# Patient Record
Sex: Male | Born: 1955 | Race: White | Hispanic: No | Marital: Married | Smoking: Never smoker
Health system: Southern US, Community
[De-identification: ages and names within clinical notes are randomized; demographics above are authoritative.]

## PROBLEM LIST (undated history)

## (undated) DIAGNOSIS — E785 Hyperlipidemia, unspecified: Secondary | ICD-10-CM

## (undated) DIAGNOSIS — E119 Type 2 diabetes mellitus without complications: Secondary | ICD-10-CM

## (undated) DIAGNOSIS — I10 Essential (primary) hypertension: Secondary | ICD-10-CM

## (undated) DIAGNOSIS — N200 Calculus of kidney: Secondary | ICD-10-CM

## (undated) HISTORY — DX: Calculus of kidney: N20.0

## (undated) HISTORY — DX: Type 2 diabetes mellitus without complications: E11.9

## (undated) HISTORY — DX: Hyperlipidemia, unspecified: E78.5

## (undated) HISTORY — DX: Essential (primary) hypertension: I10

---

## 1997-05-09 ENCOUNTER — Encounter: Admission: RE | Admit: 1997-05-09 | Discharge: 1997-07-11 | Payer: Self-pay | Admitting: Family Medicine

## 2001-09-18 ENCOUNTER — Encounter: Payer: Self-pay | Admitting: Family Medicine

## 2001-09-18 ENCOUNTER — Encounter: Admission: RE | Admit: 2001-09-18 | Discharge: 2001-09-18 | Payer: Self-pay | Admitting: Family Medicine

## 2006-03-08 LAB — HM COLONOSCOPY

## 2009-01-11 HISTORY — PX: PROSTATECTOMY: SHX69

## 2009-03-07 ENCOUNTER — Ambulatory Visit: Payer: Self-pay | Admitting: Family Medicine

## 2009-03-12 ENCOUNTER — Ambulatory Visit: Payer: Self-pay | Admitting: Family Medicine

## 2009-04-08 ENCOUNTER — Ambulatory Visit: Payer: Self-pay | Admitting: Family Medicine

## 2009-04-16 ENCOUNTER — Ambulatory Visit: Payer: Self-pay | Admitting: Family Medicine

## 2009-04-21 ENCOUNTER — Ambulatory Visit: Payer: Self-pay | Admitting: Family Medicine

## 2009-04-22 ENCOUNTER — Ambulatory Visit: Payer: Self-pay | Admitting: Family Medicine

## 2009-04-23 ENCOUNTER — Ambulatory Visit: Payer: Self-pay | Admitting: Family Medicine

## 2009-04-28 ENCOUNTER — Ambulatory Visit: Payer: Self-pay | Admitting: Family Medicine

## 2009-07-15 ENCOUNTER — Ambulatory Visit: Payer: Self-pay | Admitting: Family Medicine

## 2009-08-04 ENCOUNTER — Ambulatory Visit: Payer: Self-pay | Admitting: Family Medicine

## 2009-09-18 ENCOUNTER — Encounter (INDEPENDENT_AMBULATORY_CARE_PROVIDER_SITE_OTHER): Payer: Self-pay | Admitting: Urology

## 2009-09-18 ENCOUNTER — Inpatient Hospital Stay (HOSPITAL_COMMUNITY): Admission: RE | Admit: 2009-09-18 | Discharge: 2009-09-19 | Payer: Self-pay | Admitting: Urology

## 2009-11-13 ENCOUNTER — Ambulatory Visit
Admission: RE | Admit: 2009-11-13 | Discharge: 2009-12-02 | Payer: Self-pay | Source: Home / Self Care | Admitting: Radiation Oncology

## 2009-11-14 ENCOUNTER — Ambulatory Visit: Payer: Self-pay | Admitting: Family Medicine

## 2010-03-16 ENCOUNTER — Ambulatory Visit: Payer: Self-pay | Admitting: Family Medicine

## 2010-03-26 LAB — DIFFERENTIAL
Basophils Absolute: 0 10*3/uL (ref 0.0–0.1)
Basophils Relative: 0 % (ref 0–1)
Eosinophils Absolute: 0 10*3/uL (ref 0.0–0.7)
Eosinophils Relative: 1 % (ref 0–5)
Lymphocytes Relative: 20 % (ref 12–46)
Lymphs Abs: 1.4 10*3/uL (ref 0.7–4.0)
Monocytes Absolute: 0.5 10*3/uL (ref 0.1–1.0)
Monocytes Relative: 7 % (ref 3–12)
Neutro Abs: 4.8 10*3/uL (ref 1.7–7.7)
Neutrophils Relative %: 72 % (ref 43–77)

## 2010-03-26 LAB — CBC
HCT: 38.4 % — ABNORMAL LOW (ref 39.0–52.0)
HCT: 41.4 % (ref 39.0–52.0)
Hemoglobin: 12.9 g/dL — ABNORMAL LOW (ref 13.0–17.0)
Hemoglobin: 14.2 g/dL (ref 13.0–17.0)
MCH: 28.8 pg (ref 26.0–34.0)
MCH: 28.9 pg (ref 26.0–34.0)
MCHC: 33.6 g/dL (ref 30.0–36.0)
MCHC: 34.2 g/dL (ref 30.0–36.0)
MCV: 84.6 fL (ref 78.0–100.0)
MCV: 85.6 fL (ref 78.0–100.0)
Platelets: 90 10*3/uL — ABNORMAL LOW (ref 150–400)
Platelets: 99 10*3/uL — ABNORMAL LOW (ref 150–400)
RBC: 4.49 MIL/uL (ref 4.22–5.81)
RBC: 4.89 MIL/uL (ref 4.22–5.81)
RDW: 12.5 % (ref 11.5–15.5)
RDW: 13 % (ref 11.5–15.5)
WBC: 10.4 10*3/uL (ref 4.0–10.5)
WBC: 6.7 10*3/uL (ref 4.0–10.5)

## 2010-03-26 LAB — GLUCOSE, CAPILLARY
Glucose-Capillary: 113 mg/dL — ABNORMAL HIGH (ref 70–99)
Glucose-Capillary: 121 mg/dL — ABNORMAL HIGH (ref 70–99)
Glucose-Capillary: 127 mg/dL — ABNORMAL HIGH (ref 70–99)
Glucose-Capillary: 144 mg/dL — ABNORMAL HIGH (ref 70–99)
Glucose-Capillary: 145 mg/dL — ABNORMAL HIGH (ref 70–99)
Glucose-Capillary: 149 mg/dL — ABNORMAL HIGH (ref 70–99)
Glucose-Capillary: 150 mg/dL — ABNORMAL HIGH (ref 70–99)
Glucose-Capillary: 187 mg/dL — ABNORMAL HIGH (ref 70–99)

## 2010-03-26 LAB — ABO/RH: ABO/RH(D): O POS

## 2010-03-26 LAB — TYPE AND SCREEN
ABO/RH(D): O POS
Antibody Screen: NEGATIVE

## 2010-03-27 LAB — CBC
HCT: 46.4 % (ref 39.0–52.0)
Hemoglobin: 15.9 g/dL (ref 13.0–17.0)
MCH: 28.8 pg (ref 26.0–34.0)
MCHC: 34.2 g/dL (ref 30.0–36.0)
MCV: 84 fL (ref 78.0–100.0)
Platelets: 100 10*3/uL — ABNORMAL LOW (ref 150–400)
RBC: 5.53 MIL/uL (ref 4.22–5.81)
RDW: 12.9 % (ref 11.5–15.5)
WBC: 5.3 10*3/uL (ref 4.0–10.5)

## 2010-03-27 LAB — BASIC METABOLIC PANEL
BUN: 16 mg/dL (ref 6–23)
CO2: 27 mEq/L (ref 19–32)
Calcium: 9.4 mg/dL (ref 8.4–10.5)
Chloride: 105 mEq/L (ref 96–112)
Creatinine, Ser: 0.97 mg/dL (ref 0.4–1.5)
GFR calc Af Amer: >60 mL/min (ref >60)
GFR calc non Af Amer: >60 mL/min (ref >60)
Glucose, Bld: 129 mg/dL — ABNORMAL HIGH (ref 70–99)
Potassium: 3.9 mEq/L (ref 3.5–5.1)
Sodium: 140 mEq/L (ref 135–145)

## 2010-03-27 LAB — SURGICAL PCR SCREEN
MRSA, PCR: NEGATIVE
Staphylococcus aureus: NEGATIVE

## 2010-11-03 ENCOUNTER — Telehealth: Payer: Self-pay | Admitting: Family Medicine

## 2010-11-04 NOTE — Telephone Encounter (Signed)
FYI

## 2011-05-14 ENCOUNTER — Encounter: Payer: Self-pay | Admitting: Internal Medicine

## 2011-05-24 ENCOUNTER — Encounter: Payer: Self-pay | Admitting: Family Medicine

## 2011-05-24 ENCOUNTER — Ambulatory Visit (INDEPENDENT_AMBULATORY_CARE_PROVIDER_SITE_OTHER): Payer: Managed Care, Other (non HMO) | Admitting: Family Medicine

## 2011-05-24 VITALS — BP 120/80 | HR 63 | Ht 74.0 in | Wt 310.0 lb

## 2011-05-24 DIAGNOSIS — E1169 Type 2 diabetes mellitus with other specified complication: Secondary | ICD-10-CM

## 2011-05-24 DIAGNOSIS — Z0000 Encounter for general adult medical examination without abnormal findings: Secondary | ICD-10-CM

## 2011-05-24 DIAGNOSIS — Z8546 Personal history of malignant neoplasm of prostate: Secondary | ICD-10-CM | POA: Insufficient documentation

## 2011-05-24 DIAGNOSIS — I10 Essential (primary) hypertension: Secondary | ICD-10-CM

## 2011-05-24 DIAGNOSIS — E119 Type 2 diabetes mellitus without complications: Secondary | ICD-10-CM

## 2011-05-24 DIAGNOSIS — E1159 Type 2 diabetes mellitus with other circulatory complications: Secondary | ICD-10-CM | POA: Insufficient documentation

## 2011-05-24 DIAGNOSIS — E669 Obesity, unspecified: Secondary | ICD-10-CM | POA: Insufficient documentation

## 2011-05-24 DIAGNOSIS — I152 Hypertension secondary to endocrine disorders: Secondary | ICD-10-CM | POA: Insufficient documentation

## 2011-05-24 DIAGNOSIS — Z87442 Personal history of urinary calculi: Secondary | ICD-10-CM

## 2011-05-24 DIAGNOSIS — E785 Hyperlipidemia, unspecified: Secondary | ICD-10-CM | POA: Insufficient documentation

## 2011-05-24 LAB — LIPID PANEL
Cholesterol: 198 mg/dL (ref 0–200)
HDL: 40 mg/dL (ref >39)
LDL Cholesterol: 126 mg/dL — ABNORMAL HIGH (ref 0–99)
Total CHOL/HDL Ratio: 5 Ratio
Triglycerides: 158 mg/dL — ABNORMAL HIGH (ref <150)
VLDL: 32 mg/dL (ref 0–40)

## 2011-05-24 LAB — POCT URINALYSIS DIPSTICK
Bilirubin, UA: NEGATIVE
Blood, UA: NEGATIVE
Glucose, UA: NEGATIVE
Ketones, UA: NEGATIVE
Leukocytes, UA: NEGATIVE
Nitrite, UA: NEGATIVE
Protein, UA: NEGATIVE
Spec Grav, UA: 1.02
Urobilinogen, UA: NEGATIVE
pH, UA: 5

## 2011-05-24 LAB — POCT GLYCOSYLATED HEMOGLOBIN (HGB A1C): Hemoglobin A1C: 6.7

## 2011-05-24 LAB — COMPREHENSIVE METABOLIC PANEL
ALT: 37 U/L (ref 0–53)
AST: 36 U/L (ref 0–37)
Albumin: 4.6 g/dL (ref 3.5–5.2)
Alkaline Phosphatase: 70 U/L (ref 39–117)
BUN: 20 mg/dL (ref 6–23)
CO2: 28 mEq/L (ref 19–32)
Calcium: 9.5 mg/dL (ref 8.4–10.5)
Chloride: 105 mEq/L (ref 96–112)
Creat: 1.06 mg/dL (ref 0.50–1.35)
Glucose, Bld: 120 mg/dL — ABNORMAL HIGH (ref 70–99)
Potassium: 4.3 mEq/L (ref 3.5–5.3)
Sodium: 141 mEq/L (ref 135–145)
Total Bilirubin: 0.8 mg/dL (ref 0.3–1.2)
Total Protein: 6.9 g/dL (ref 6.0–8.3)

## 2011-05-24 LAB — POCT UA - MICROALBUMIN
Albumin/Creatinine Ratio, Urine, POC: 2.9
Creatinine, POC: 215.7 mg/dL
Microalbumin Ur, POC: 603 mg/dL

## 2011-05-24 NOTE — Progress Notes (Signed)
Subjective:    Patient ID: Jason Keith, male    DOB: 1955/01/21, 56 y.o.   MRN: [id number]  HPI He is here for a several year absence due to lack of insurance. He recently got a job and is now eating much better care of himself. He is back on metformin. He is walking one half hour on a daily basis and apparently has lost weight due to this. He has had prostate cancer surgery and has not followed up with urology years. He states that he no longer needs Cialis and has had no urinary leakage problems. He is also had no chest pain, DOE, shortness of breath. His immunizations are up to date. Social history was reviewed.   Review of Systems Negative except as above    Objective:   Physical Exam BP 120/80  Pulse 63  Ht 6\' 2"  (1.88 m)  Wt 310 lb (140.615 kg)  BMI 39.80 kg/m2  General Appearance:    Alert, cooperative, no distress, appears stated age  Head:    Normocephalic, without obvious abnormality, atraumatic     Ears:    Normal TM's and external ear canals  Nose:   Nares normal, mucosa normal, no drainage or sinus   tenderness  Throat:   Lips, mucosa, and tongue normal; teeth and gums normal  Neck:   Supple, no lymphadenopathy;  thyroid:  no   enlargement/tenderness/nodules; no carotid   bruit or JVD  Back:    Spine nontender, no curvature, ROM normal, no CVA     tenderness  Lungs:     Clear to auscultation bilaterally without wheezes, rales or     ronchi; respirations unlabored  Chest Wall:    No tenderness or deformity   Heart:    Regular rate and rhythm, S1 and S2 normal, no murmur, rub   or gallop  Breast Exam:    No chest wall tenderness, masses or gynecomastia  Abdomen:     Soft, non-tender, nondistended, normoactive bowel sounds,    no masses, no hepatosplenomegaly  Genitalia:    Normal male external genitalia without lesions.  Testicles without masses.  No inguinal hernias.  Rectal:    Normal sphincter tone, no masses or tenderness; guaiac negative stool.  Prostate not  palpable   Extremities:   No clubbing, cyanosis or edema  Pulses:   2+ and symmetric all extremities  Skin:   Skin color, texture, turgor normal, no rashes or lesions  Lymph nodes:   Cervical, supraclavicular, and axillary nodes normal  Neurologic:   CNII-XII intact, normal strength, sensation and gait; reflexes 2+ and symmetric throughout          Psych:   Normal mood, affect, hygiene and grooming.    Globin A1c of 6.7.       Assessment & Plan:   1. Routine general medical examination at a health care facility  POCT Urinalysis Dipstick, CBC with Differential, Comprehensive metabolic panel, Lipid panel, POCT UA - Microalbumin, HgB A1c, Hemoccult - 1 Card (office)  2. Obesity (BMI 30-39.9)    3. Diabetes mellitus  POCT UA - Microalbumin, HgB A1c  4. Hyperlipidemia LDL goal <70  Lipid panel  5. History of prostate cancer    6. Hypertension associated with diabetes    7. History of renal stone     I encouraged him to continue with his is a collectively level. Routine blood screening will be done. I will place him back on medication after it had a chance to look  at his blood work. Recheck here in about 4 months.

## 2011-05-25 ENCOUNTER — Telehealth: Payer: Self-pay | Admitting: Family Medicine

## 2011-05-25 LAB — CBC WITH DIFFERENTIAL/PLATELET
Basophils Absolute: 0 10*3/uL (ref 0.0–0.1)
Basophils Relative: 0 % (ref 0–1)
Eosinophils Absolute: 0.1 10*3/uL (ref 0.0–0.7)
Eosinophils Relative: 2 % (ref 0–5)
HCT: 46.2 % (ref 39.0–52.0)
Hemoglobin: 14.8 g/dL (ref 13.0–17.0)
Lymphocytes Relative: 40 % (ref 12–46)
Lymphs Abs: 1.5 10*3/uL (ref 0.7–4.0)
MCH: 27.5 pg (ref 26.0–34.0)
MCHC: 32 g/dL (ref 30.0–36.0)
MCV: 85.9 fL (ref 78.0–100.0)
Monocytes Absolute: 0.3 10*3/uL (ref 0.1–1.0)
Monocytes Relative: 9 % (ref 3–12)
Neutro Abs: 1.9 10*3/uL (ref 1.7–7.7)
Neutrophils Relative %: 49 % (ref 43–77)
RBC: 5.38 MIL/uL (ref 4.22–5.81)
RDW: 13 % (ref 11.5–15.5)
WBC: 3.8 10*3/uL — ABNORMAL LOW (ref 4.0–10.5)

## 2011-05-25 MED ORDER — METFORMIN HCL ER (MOD) 500 MG PO TB24: 500.0000 mg | ORAL_TABLET | Freq: Every day | ORAL | Status: DC

## 2011-05-25 MED ORDER — PRAVASTATIN SODIUM 40 MG PO TABS: 40.0000 mg | ORAL_TABLET | Freq: Every day | ORAL | Status: DC

## 2011-05-25 MED ORDER — LISINOPRIL 10 MG PO TABS: 10.0000 mg | ORAL_TABLET | Freq: Every day | ORAL | Status: DC

## 2011-05-25 MED ORDER — TADALAFIL 5 MG PO TABS: 5.0000 mg | ORAL_TABLET | Freq: Every day | ORAL | Status: DC | PRN

## 2011-05-25 NOTE — Progress Notes (Signed)
Addended by: Ronnald Nian on: 05/25/2011 10:58 AM   Modules accepted: Orders

## 2011-05-26 ENCOUNTER — Other Ambulatory Visit: Payer: Self-pay

## 2011-05-26 MED ORDER — METFORMIN HCL ER (MOD) 500 MG PO TB24: 500.0000 mg | ORAL_TABLET | Freq: Every day | ORAL | Status: DC

## 2011-05-26 NOTE — Telephone Encounter (Signed)
Pt called and said cials was sent in he didn't want and they said they didn't have the metformin so discontinue cials and reordered metformin

## 2011-05-27 ENCOUNTER — Other Ambulatory Visit: Payer: Self-pay

## 2011-05-27 MED ORDER — METFORMIN HCL ER 500 MG PO TB24: 500.0000 mg | ORAL_TABLET | Freq: Every day | ORAL | Status: DC

## 2011-05-27 NOTE — Telephone Encounter (Signed)
Pt called and said it was wrong med

## 2011-06-04 ENCOUNTER — Telehealth: Payer: Self-pay | Admitting: Family Medicine

## 2011-06-04 NOTE — Telephone Encounter (Signed)
LM

## 2011-06-08 NOTE — Telephone Encounter (Signed)
LM

## 2011-07-16 ENCOUNTER — Other Ambulatory Visit: Payer: Self-pay

## 2011-07-16 MED ORDER — LISINOPRIL 10 MG PO TABS: 10.0000 mg | ORAL_TABLET | Freq: Every day | ORAL | Status: DC

## 2011-07-19 ENCOUNTER — Other Ambulatory Visit: Payer: Self-pay

## 2011-07-19 ENCOUNTER — Telehealth: Payer: Self-pay | Admitting: Internal Medicine

## 2011-07-19 MED ORDER — METFORMIN HCL ER 500 MG PO TB24: 500.0000 mg | ORAL_TABLET | Freq: Every day | ORAL | Status: DC

## 2011-07-19 NOTE — Telephone Encounter (Signed)
MED SENT IN 

## 2011-08-11 ENCOUNTER — Encounter: Payer: Self-pay | Admitting: Medical

## 2011-08-11 ENCOUNTER — Ambulatory Visit (INDEPENDENT_AMBULATORY_CARE_PROVIDER_SITE_OTHER): Payer: Managed Care, Other (non HMO) | Admitting: Medical

## 2011-08-11 VITALS — BP 90/60 | HR 64 | Temp 98.2°F | Resp 16 | Wt 297.0 lb

## 2011-08-11 DIAGNOSIS — H109 Unspecified conjunctivitis: Secondary | ICD-10-CM

## 2011-08-11 DIAGNOSIS — H01003 Unspecified blepharitis right eye, unspecified eyelid: Secondary | ICD-10-CM

## 2011-08-11 DIAGNOSIS — H01009 Unspecified blepharitis unspecified eye, unspecified eyelid: Secondary | ICD-10-CM

## 2011-08-11 MED ORDER — POLYMYXIN B-TRIMETHOPRIM 10000-0.1 UNIT/ML-% OP SOLN: 2.0000 [drp] | OPHTHALMIC | Status: AC

## 2011-08-11 NOTE — Progress Notes (Signed)
Subjective: Here for right eye c/o.  He notes 3 day hx/o right eye lid swelling, eye redness, crusting, watery discharge and irritation.   No sick contacts with same.  Using nothing for symptoms.   Objective: Gen: wd, wn, nad  Eye: right upper and lower eye lid swollen and erythematous, conjunctive injected throughout, some crusting of eyelids, watery, left eye and eyelids normal appearing, PERRLA, EOMi Neck: supple, no lymphadenopathy  Assessment: Encounter Diagnoses  Name Primary?  . Conjunctivitis Yes  . Blepharitis of right eye     Plan: Script for Polytrim as he prefers drops, moist compresses, avoid touching the area otherwise, discussed hygiene avoiding spread of the infection to left eye and other people.  Call/return if not improving in 2-3 days.

## 2011-08-11 NOTE — Patient Instructions (Signed)
Blepharitis Blepharitis is redness, soreness, and swelling (inflammation) of one or both eyelids. It may be caused by an allergic reaction or a bacterial infection. Blepharitis may also be associated with reddened, scaly skin (seborrhea) of the scalp and eyebrows. While you sleep, eye discharge may cause your eyelashes to stick together. Your eyelids may itch, burn, swell, and may lose their lashes. These will grow back. Your eyes may become sensitive. Blepharitis may recur and need repeated treatment. If this is the case, you may require further evaluation by an eye specialist (ophthalmologist). HOME CARE INSTRUCTIONS   Keep your hands clean.   Use a clean towel each time you dry your eyelids. Do not use this towel to clean other areas. Do not share a towel or makeup with anyone.   Wash your eyelids with warm water or warm water mixed with a small amount of baby shampoo. Do this twice a day or as often as needed.   Wash your face and eyebrows at least once a day.   Use warm compresses 2 times a day for 10 minutes at a time, or as directed by your caregiver.   Apply antibiotic ointment as directed by your caregiver.   Avoid rubbing your eyes.   Avoid wearing makeup until you get better.   Follow up with your caregiver as directed.  SEEK IMMEDIATE MEDICAL CARE IF:   You have pain, redness, or swelling that gets worse or spreads to other parts of your face.   Your vision changes, or you have pain when looking at lights or moving objects.   You have a fever.   Your symptoms continue for longer than 2 to 4 days or become worse.  MAKE SURE YOU:   Understand these instructions.   Will watch your condition.   Will get help right away if you are not doing well or get worse.  Document Released: 12/26/1999 Document Revised: 12/17/2010 Document Reviewed: 02/04/2010 [facility] Patient Information 2012 [place], [place].   Conjunctivitis Conjunctivitis is commonly called "pink eye."  Conjunctivitis can be caused by bacterial or viral infection, allergies, or injuries. There is usually redness of the lining of the eye, itching, discomfort, and sometimes discharge. There may be deposits of matter along the eyelids. A viral infection usually causes a watery discharge, while a bacterial infection causes a yellowish, thick discharge. Pink eye is very contagious and spreads by direct contact. You may be given antibiotic eyedrops as part of your treatment. Before using your eye medicine, remove all drainage from the eye by washing gently with warm water and cotton balls. Continue to use the medication until you have awakened 2 mornings in a row without discharge from the eye. Do not rub your eye. This increases the irritation and helps spread infection. Use separate towels from other household members. Wash your hands with soap and water before and after touching your eyes. Use cold compresses to reduce pain and sunglasses to relieve irritation from light. Do not wear contact lenses or wear eye makeup until the infection is gone. SEEK MEDICAL CARE IF:   Your symptoms are not better after 3 days of treatment.   You have increased pain or trouble seeing.   The outer eyelids become very red or swollen.  Document Released: 02/05/2004 Document Revised: 12/17/2010 Document Reviewed: 12/28/2004 [facility] Patient Information 2012 [place], [place].

## 2011-09-08 ENCOUNTER — Telehealth: Payer: Self-pay | Admitting: Internal Medicine

## 2011-09-08 MED ORDER — PRAVASTATIN SODIUM 40 MG PO TABS: 40.0000 mg | ORAL_TABLET | Freq: Every day | ORAL | Status: DC

## 2011-09-08 NOTE — Telephone Encounter (Signed)
Pharmacy called stating that his insurance will only pay for 90 days

## 2011-09-24 ENCOUNTER — Ambulatory Visit (INDEPENDENT_AMBULATORY_CARE_PROVIDER_SITE_OTHER): Payer: Managed Care, Other (non HMO) | Admitting: Family Medicine

## 2011-09-24 ENCOUNTER — Encounter: Payer: Self-pay | Admitting: Family Medicine

## 2011-09-24 VITALS — BP 124/80 | HR 88 | Wt 292.0 lb

## 2011-09-24 DIAGNOSIS — E1159 Type 2 diabetes mellitus with other circulatory complications: Secondary | ICD-10-CM

## 2011-09-24 DIAGNOSIS — I10 Essential (primary) hypertension: Secondary | ICD-10-CM

## 2011-09-24 DIAGNOSIS — Z23 Encounter for immunization: Secondary | ICD-10-CM

## 2011-09-24 DIAGNOSIS — E669 Obesity, unspecified: Secondary | ICD-10-CM

## 2011-09-24 DIAGNOSIS — E1169 Type 2 diabetes mellitus with other specified complication: Secondary | ICD-10-CM

## 2011-09-24 DIAGNOSIS — E119 Type 2 diabetes mellitus without complications: Secondary | ICD-10-CM

## 2011-09-24 DIAGNOSIS — E785 Hyperlipidemia, unspecified: Secondary | ICD-10-CM

## 2011-09-24 LAB — LIPID PANEL
Cholesterol: 141 mg/dL (ref 0–200)
HDL: 35 mg/dL — ABNORMAL LOW (ref >39)
LDL Cholesterol: 83 mg/dL (ref 0–99)
Total CHOL/HDL Ratio: 4 Ratio
Triglycerides: 117 mg/dL (ref <150)
VLDL: 23 mg/dL (ref 0–40)

## 2011-09-24 LAB — POCT GLYCOSYLATED HEMOGLOBIN (HGB A1C): Hemoglobin A1C: 6.1

## 2011-09-24 NOTE — Patient Instructions (Signed)
Keep up the good work

## 2011-09-24 NOTE — Progress Notes (Signed)
  Subjective:    Patient ID: Jason Keith, male    DOB: [birth year], 56 y.o.   MRN: [id number]  HPI He is here for recheck. His weight is down. He has a new routine where he is drinking more fluids. He walks everyday for a little under a half an hour. His blood sugars after meals run around 120 he continues on medications listed in the chart. He is now 2 years down the road from prostate surgery and his last PSA was apparently undetectable. He does not smoke or drink. He does periodically check his feet and has seen his optometrist within the last year. Review his record indicates an elevated LDL.   Review of Systems     Objective:   Physical Exam Alert and in no distress. He will A1c is 6.1.       Assessment & Plan:   1. Diabetes mellitus  POCT glycosylated hemoglobin (Hb A1C)  2. Hyperlipidemia LDL goal <70  Lipid panel  3. Hypertension associated with diabetes    4. Obesity (BMI 30-39.9)     I encouraged him to continue with the good work that he is doing.

## 2011-09-27 ENCOUNTER — Other Ambulatory Visit: Payer: Self-pay

## 2011-09-27 MED ORDER — METFORMIN HCL ER 500 MG PO TB24: 500.0000 mg | ORAL_TABLET | Freq: Every day | ORAL | Status: AC

## 2011-09-27 NOTE — Telephone Encounter (Signed)
PT MED SENT IN

## 2012-01-25 ENCOUNTER — Ambulatory Visit: Payer: Managed Care, Other (non HMO) | Admitting: Family Medicine

## 2013-08-22 ENCOUNTER — Other Ambulatory Visit: Payer: Self-pay | Admitting: Internal Medicine

## 2013-08-22 DIAGNOSIS — I739 Peripheral vascular disease, unspecified: Secondary | ICD-10-CM

## 2013-08-27 ENCOUNTER — Ambulatory Visit
Admission: RE | Admit: 2013-08-27 | Discharge: 2013-08-27 | Disposition: A | Discharge status: Home / Self Care | Payer: Managed Care, Other (non HMO) | Source: Ambulatory Visit | Attending: Internal Medicine | Admitting: Internal Medicine

## 2013-08-27 DIAGNOSIS — I739 Peripheral vascular disease, unspecified: Secondary | ICD-10-CM

## 2014-11-21 ENCOUNTER — Encounter (HOSPITAL_COMMUNITY): Payer: Self-pay | Admitting: Emergency Medicine

## 2014-11-21 ENCOUNTER — Emergency Department (HOSPITAL_COMMUNITY)
Admission: EM | Admit: 2014-11-21 | Discharge: 2014-11-21 | Disposition: A | Discharge status: Home / Self Care | Payer: Managed Care, Other (non HMO) | Attending: Emergency Medicine | Admitting: Emergency Medicine

## 2014-11-21 DIAGNOSIS — Y998 Other external cause status: Secondary | ICD-10-CM | POA: Insufficient documentation

## 2014-11-21 DIAGNOSIS — E785 Hyperlipidemia, unspecified: Secondary | ICD-10-CM | POA: Insufficient documentation

## 2014-11-21 DIAGNOSIS — Z79899 Other long term (current) drug therapy: Secondary | ICD-10-CM | POA: Diagnosis not present

## 2014-11-21 DIAGNOSIS — Z7982 Long term (current) use of aspirin: Secondary | ICD-10-CM | POA: Diagnosis not present

## 2014-11-21 DIAGNOSIS — I10 Essential (primary) hypertension: Secondary | ICD-10-CM | POA: Insufficient documentation

## 2014-11-21 DIAGNOSIS — Y9389 Activity, other specified: Secondary | ICD-10-CM | POA: Insufficient documentation

## 2014-11-21 DIAGNOSIS — S60041A Contusion of right ring finger without damage to nail, initial encounter: Secondary | ICD-10-CM | POA: Diagnosis not present

## 2014-11-21 DIAGNOSIS — S6000XA Contusion of unspecified finger without damage to nail, initial encounter: Secondary | ICD-10-CM

## 2014-11-21 DIAGNOSIS — Y92009 Unspecified place in unspecified non-institutional (private) residence as the place of occurrence of the external cause: Secondary | ICD-10-CM | POA: Insufficient documentation

## 2014-11-21 DIAGNOSIS — W2209XA Striking against other stationary object, initial encounter: Secondary | ICD-10-CM | POA: Insufficient documentation

## 2014-11-21 DIAGNOSIS — E119 Type 2 diabetes mellitus without complications: Secondary | ICD-10-CM | POA: Diagnosis not present

## 2014-11-21 DIAGNOSIS — S6991XA Unspecified injury of right wrist, hand and finger(s), initial encounter: Secondary | ICD-10-CM | POA: Diagnosis present

## 2014-11-21 DIAGNOSIS — Z87442 Personal history of urinary calculi: Secondary | ICD-10-CM | POA: Insufficient documentation

## 2014-11-21 MED ORDER — ACETAMINOPHEN 500 MG PO TABS: 500.0000 mg | ORAL_TABLET | Freq: Four times a day (QID) | ORAL | Status: DC | PRN

## 2014-11-21 NOTE — ED Notes (Signed)
Pt from home c/o  Slapping his hand on the computer desk and a knot in the right finger. No bleeding noted but hematoma noted.

## 2014-11-21 NOTE — Discharge Instructions (Signed)
Take tylenol as needed for pain. Apply ice 3-4 times per day for 15-20 minutes each time. Follow up with your primary care doctor for wound recheck, if needed.  Contusion A contusion is a deep bruise. Contusions are the result of a blunt injury to tissues and muscle fibers under the skin. The injury causes bleeding under the skin. The skin overlying the contusion may turn blue, purple, or yellow. Minor injuries will give you a painless contusion, but more severe contusions may stay painful and swollen for a few weeks.  CAUSES  This condition is usually caused by a blow, trauma, or direct force to an area of the body. SYMPTOMS  Symptoms of this condition include:  Swelling of the injured area.  Pain and tenderness in the injured area.  Discoloration. The area may have redness and then turn blue, purple, or yellow. DIAGNOSIS  This condition is diagnosed based on a physical exam and medical history. An X-ray, CT scan, or MRI may be needed to determine if there are any associated injuries, such as broken bones (fractures). TREATMENT  Specific treatment for this condition depends on what area of the body was injured. In general, the best treatment for a contusion is resting, icing, applying pressure to (compression), and elevating the injured area. This is often called the RICE strategy. Over-the-counter anti-inflammatory medicines may also be recommended for pain control.  HOME CARE INSTRUCTIONS   Rest the injured area.  If directed, apply ice to the injured area:  Put ice in a plastic bag.  Place a towel between your skin and the bag.  Leave the ice on for 20 minutes, 2-3 times per day.  If directed, apply light compression to the injured area using an elastic bandage. Make sure the bandage is not wrapped too tightly. Remove and reapply the bandage as directed by your health care provider.  If possible, raise (elevate) the injured area above the level of your heart while you are sitting  or lying down.  Take over-the-counter and prescription medicines only as told by your health care provider. SEEK MEDICAL CARE IF:  Your symptoms do not improve after several days of treatment.  Your symptoms get worse.  You have difficulty moving the injured area. SEEK IMMEDIATE MEDICAL CARE IF:   You have severe pain.  You have numbness in a hand or foot.  Your hand or foot turns pale or cold.   This information is not intended to replace advice given to you by your health care provider. Make sure you discuss any questions you have with your health care provider.   Document Released: 10/07/2004 Document Revised: 09/18/2014 Document Reviewed: 05/15/2014 Elsevier Interactive Patient Education [employer]2016 Elsevier Inc.

## 2014-11-21 NOTE — ED Provider Notes (Signed)
History  By signing my name below, I, Karle PlumberJennifer Tensley, attest that this documentation has been prepared under the direction and in the presence of TRW AutomotiveKelly Jazlyn Tippens, PA-C. Electronically Signed: Karle PlumberJennifer Tensley, ED Scribe. 11/21/2014. 9:06 PM.  Chief Complaint  Patient presents with  . Finger Injury   The history is provided by the patient and medical records. No language interpreter was used.    HPI Comments:  Jason Keith is a 59 y.o. male who presents to the Emergency Department complaining of an injury to the right fourth finger that occurred approximately 2 hours ago. He states he slapped his computer desk after becoming angry with his computer and the finger began swelling immediately. He reports moderate pain. He has applied ice in triage but has not taken anything for the pain. He denies modifying factors of the pain. He denies numbness, tingling or weakness of the right 4th finger or right hand, fever, chills, open wounds, nausea or vomiting.   Past Medical History  Diagnosis Date  . DM (diabetes mellitus), type 2 (HCC)   . Allergic rhinitis   . Renal stones   . HTN (hypertension)   . Dyslipidemia    Past Surgical History  Procedure Laterality Date  . Prostatectomy  2011    Border   No family history on file. Social History  Substance Use Topics  . Smoking status: Never Smoker   . Smokeless tobacco: Never Used  . Alcohol Use: No    Review of Systems  Musculoskeletal: Positive for arthralgias.  Skin: Positive for color change.  All other systems reviewed and are negative.   Allergies  Review of patient's allergies indicates no known allergies.  Home Medications   Prior to Admission medications   Medication Sig Start Date End Date Taking? Authorizing Provider  acetaminophen (TYLENOL) 500 MG tablet Take 1 tablet (500 mg total) by mouth every 6 (six) hours as needed. 11/21/14   Antony MaduraKelly Nyle Limb, PA-C  aspirin 81 MG tablet Take 81 mg by mouth daily.    Historical  Provider, MD  lisinopril (PRINIVIL,ZESTRIL) 10 MG tablet Take 1 tablet (10 mg total) by mouth daily. 07/16/11 07/15/12  Ronnald NianJohn C Lalonde, MD  metFORMIN (GLUCOPHAGE-XR) 500 MG 24 hr tablet Take 1 tablet (500 mg total) by mouth daily with breakfast. 09/27/11   Ronnald NianJohn C Lalonde, MD  Multiple Vitamins-Minerals (MULTIVITAMIN WITH MINERALS) tablet Take 1 tablet by mouth daily.    Historical Provider, MD  pravastatin (PRAVACHOL) 40 MG tablet Take 1 tablet (40 mg total) by mouth daily. 09/08/11   Ronnald NianJohn C Lalonde, MD   Triage Vitals: BP 127/73 mmHg  Pulse 81  Temp(Src) 98.3 F (36.8 C) (Oral)  Resp 18  SpO2 98%  Physical Exam  Constitutional: He is oriented to person, place, and time. He appears well-developed and well-nourished. No distress.  Nontoxic/nonseptic appearing  HENT:  Head: Normocephalic and atraumatic.  Eyes: Conjunctivae and EOM are normal. No scleral icterus.  Neck: Normal range of motion.  Cardiovascular: Normal rate, regular rhythm and intact distal pulses.   Capillary refill brisk in distal tip of R 4th digit.  Pulmonary/Chest: Effort normal. No respiratory distress.  Musculoskeletal: Normal range of motion.  Normal ROM of FDP, FDS, and extensors of R 4th digit. Strength is 5/5 against resistance in R 4th digit. No bony TTP or deformity. No crepitus.  Neurological: He is alert and oriented to person, place, and time. He exhibits normal muscle tone. Coordination normal.  Sensation to light touch intact in the R  4th digit.   Skin: Skin is warm and dry. No rash noted. He is not diaphoretic. No erythema. No pallor.  Mild ecchymosis and swelling to the fat pad of the R 4th digit.  Psychiatric: He has a normal mood and affect. His behavior is normal.  Nursing note and vitals reviewed.   ED Course  Procedures (including critical care time) DIAGNOSTIC STUDIES: Oxygen Saturation is 98% on RA, normal by my interpretation.   COORDINATION OF CARE: 9:06 PM- Offered to X-Ray right fourth  finger but pt declined stating he did not believe a bone was broken. Encouraged pt to continue to ice the area. Pt verbalizes understanding and agrees to plan.   MDM   Final diagnoses:  Contusion, finger, initial encounter    59 year old male percent so the emergency department for symptoms consistent with a contusion to the fat pad of the right fourth digit. Patient is neurovascularly intact. No pallor or paresthesias. No coolness/poikilothermia to the affected digit. Doubt fracture. No indication for imaging. Symptoms to be managed as outpatient with Tylenol and icing. Return precautions discussed and provided. Patient agreeable to plan with no unaddressed concerns. Patient discharged in good condition.  I personally performed the services described in this documentation, which was scribed in my presence. The recorded information has been reviewed and is accurate.   Filed Vitals:   11/21/14 1945  BP: 127/73  Pulse: 81  Temp: 98.3 F (36.8 C)  TempSrc: Oral  Resp: 18  SpO2: 98%      Antony Madura, PA-C 11/21/14 2117  Derwood Kaplan, MD 11/22/14 1610

## 2016-11-12 DIAGNOSIS — E669 Obesity, unspecified: Secondary | ICD-10-CM | POA: Diagnosis not present

## 2016-11-12 DIAGNOSIS — E119 Type 2 diabetes mellitus without complications: Secondary | ICD-10-CM | POA: Diagnosis not present

## 2016-11-12 DIAGNOSIS — F339 Major depressive disorder, recurrent, unspecified: Secondary | ICD-10-CM | POA: Diagnosis not present

## 2016-11-12 DIAGNOSIS — E785 Hyperlipidemia, unspecified: Secondary | ICD-10-CM | POA: Diagnosis not present

## 2016-11-12 DIAGNOSIS — Z23 Encounter for immunization: Secondary | ICD-10-CM | POA: Diagnosis not present

## 2016-11-16 ENCOUNTER — Telehealth: Payer: Self-pay | Admitting: Oncology

## 2016-11-16 ENCOUNTER — Encounter: Payer: Self-pay | Admitting: Oncology

## 2016-11-16 NOTE — Telephone Encounter (Signed)
Appt has been scheduled for the pt to see Dr. Clelia CroftShadad on 12/7 at 11am. Pt aware to arrive 30 minutes early. Letter mailed.

## 2016-12-17 ENCOUNTER — Ambulatory Visit (HOSPITAL_BASED_OUTPATIENT_CLINIC_OR_DEPARTMENT_OTHER): Payer: BLUE CROSS/BLUE SHIELD | Admitting: Oncology

## 2016-12-17 VITALS — BP 120/73 | HR 62 | Temp 97.9°F | Resp 18 | Ht 74.0 in | Wt 293.3 lb

## 2016-12-17 DIAGNOSIS — D696 Thrombocytopenia, unspecified: Secondary | ICD-10-CM

## 2016-12-17 NOTE — Progress Notes (Signed)
Reason for Referral: Thrombocytopenia.  HPI: Jason Keith is a 61 year old gentleman currently of [place] where he lived the majority of his life.  He has a history of prostate cancer and underwent a prostatectomy in 2011.  He was noted to have thrombocytopenia for at least the last 30 years.  He has been told repeatedly that he has this issue.  He underwent his prostatectomy without any excessive bleeding.  He had multiple dental surgeries without any postoperative bleeding.  His platelet count in 2011 have ranged close to 90,000.  In 2013 his platelet count was not able to be determined because of platelet clumping on a peripheral smear.  His recent CBC done on November 05, 2016 showed a white cell count of 3.6, hemoglobin of 14.7 with a platelet count of 65.  He has a normal differential and a normal.  His CBC from June 2018 showed a white cell count of 5.1 and a platelet count of 85.  Clinically he reports no symptoms of active bleeding.  He denied hematochezia, melena or epistaxis.  He denies any easy bruising or petechiae.  He remains active and attends to activities of daily living.  He denies any alcohol or tobacco use.  No documented history of cirrhosis of the liver.  He does not report any headaches, blurry vision, syncope or seizures.  He does not report any fevers or chills or sweats.  He does not report any cough, wheezing or hemoptysis.  He does not report any nausea, vomiting or abdominal pain.  He does not report any frequency urgency or hesitancy.  He does not report skeletal complaints.  Review of systems unremarkable.  Past Medical History:  Diagnosis Date  . Allergic rhinitis   . DM (diabetes mellitus), type 2 (HCC)   . Dyslipidemia   . HTN (hypertension)   . Renal stones   :  Past Surgical History:  Procedure Laterality Date  . PROSTATECTOMY  2011   Border  :   Current Outpatient Medications:  .  acetaminophen (TYLENOL) 500 MG tablet, Take 1 tablet (500 mg total)  by mouth every 6 (six) hours as needed., Disp: 30 tablet, Rfl: 0 .  aspirin 81 MG tablet, Take 81 mg by mouth daily., Disp: , Rfl:  .  metFORMIN (GLUCOPHAGE-XR) 500 MG 24 hr tablet, Take 1 tablet (500 mg total) by mouth daily with breakfast. (Patient taking differently: Take 500 mg by mouth 2 (two) times daily. ), Disp: 90 tablet, Rfl: 0 .  Multiple Vitamins-Minerals (MULTIVITAMIN WITH MINERALS) tablet, Take 1 tablet by mouth daily., Disp: , Rfl:  .  pravastatin (PRAVACHOL) 40 MG tablet, Take 1 tablet (40 mg total) by mouth daily., Disp: 90 tablet, Rfl: 2 .  tamsulosin (FLOMAX) 0.4 MG CAPS capsule, Take 0.4 mg by mouth., Disp: , Rfl:  .  traZODone (DESYREL) 100 MG tablet, TAKE 1 TABLET BY MOUTH EVERYDAY AT BEDTIME, Disp: , Rfl: 1:  No Known Allergies:  No family history on file.:  Social History   Socioeconomic History  . Marital status: Married    Spouse name: Not on file  . Number of children: Not on file  . Years of education: Not on file  . Highest education level: Not on file  Social Needs  . Financial resource strain: Not on file  . Food insecurity - worry: Not on file  . Food insecurity - inability: Not on file  . Transportation needs - medical: Not on file  . Transportation needs - non-medical: Not on  file  Occupational History  . Not on file  Tobacco Use  . Smoking status: Never Smoker  . Smokeless tobacco: Never Used  Substance and Sexual Activity  . Alcohol use: No  . Drug use: No  . Sexual activity: Yes  Other Topics Concern  . Not on file  Social History Narrative  . Not on file  :  Pertinent items are noted in HPI.  Exam: Blood pressure 120/73, pulse 62, temperature 97.9 F (36.6 C), temperature source Oral, resp. rate 18, height 6\' 2"  (1.88 m), weight 293 lb 4.8 oz (133 kg), SpO2 98 %.  ECOG 0 General appearance: alert and cooperative appeared without distress. Throat: No oral thrush or ulcers. Neck: no adenopathy Back: negative Resp: clear to  auscultation bilaterally Cardio: regular rate and rhythm, S1, S2 normal, no murmur, click, rub or gallop GI: soft, non-tender; bowel sounds normal; no masses,  no organomegaly Extremities: extremities normal, atraumatic, no cyanosis or edema Skin: Skin color, texture, turgor normal. No rashes or lesions Lymph nodes: Cervical, supraclavicular, and axillary nodes normal.    Assessment and Plan:   [id number] year old gentleman with the following issues:  1.  Thrombocytopenia that has been fluctuating and rather mild for at least last 7 years.  Per his report he has been told he has thrombocytopenia for the last 30 years.  The differential diagnosis was reviewed today which include thrombocytopenia due to platelet clumping which has been documented in the past.  Other options are include immune thrombocytopenia and myelodysplasia.  These options are considered less likely at this time.  From a management standpoint, he does not have any active bleeding and his platelet count remains adequate and his thrombocytopenia is mild.  I recommended continued observation and surveillance for the time being.  If his platelet counts continue to drop, we will consider a workup which includes platelet count in a citrate based tube to avoid clumping as well as evaluation for hepatosplenomegaly.  At this time, he prefers to continue following with his primary care physician and will return for evaluation if platelets drop in the future or he develops any symptoms.  2.  Follow-up: I am happy to see him at any time for reevaluation.

## 2017-02-04 DIAGNOSIS — E785 Hyperlipidemia, unspecified: Secondary | ICD-10-CM | POA: Diagnosis not present

## 2017-02-04 DIAGNOSIS — E119 Type 2 diabetes mellitus without complications: Secondary | ICD-10-CM | POA: Diagnosis not present

## 2017-02-11 DIAGNOSIS — E785 Hyperlipidemia, unspecified: Secondary | ICD-10-CM | POA: Diagnosis not present

## 2017-02-11 DIAGNOSIS — E119 Type 2 diabetes mellitus without complications: Secondary | ICD-10-CM | POA: Diagnosis not present

## 2017-02-11 DIAGNOSIS — F339 Major depressive disorder, recurrent, unspecified: Secondary | ICD-10-CM | POA: Diagnosis not present

## 2017-02-11 DIAGNOSIS — E669 Obesity, unspecified: Secondary | ICD-10-CM | POA: Diagnosis not present

## 2017-07-08 DIAGNOSIS — E785 Hyperlipidemia, unspecified: Secondary | ICD-10-CM | POA: Diagnosis not present

## 2017-07-08 DIAGNOSIS — E119 Type 2 diabetes mellitus without complications: Secondary | ICD-10-CM | POA: Diagnosis not present

## 2017-07-08 DIAGNOSIS — E669 Obesity, unspecified: Secondary | ICD-10-CM | POA: Diagnosis not present

## 2017-08-05 DIAGNOSIS — E119 Type 2 diabetes mellitus without complications: Secondary | ICD-10-CM | POA: Diagnosis not present

## 2017-08-05 DIAGNOSIS — E785 Hyperlipidemia, unspecified: Secondary | ICD-10-CM | POA: Diagnosis not present

## 2017-08-05 DIAGNOSIS — Z125 Encounter for screening for malignant neoplasm of prostate: Secondary | ICD-10-CM | POA: Diagnosis not present

## 2017-08-12 DIAGNOSIS — Z0001 Encounter for general adult medical examination with abnormal findings: Secondary | ICD-10-CM | POA: Diagnosis not present

## 2017-11-05 DIAGNOSIS — H00011 Hordeolum externum right upper eyelid: Secondary | ICD-10-CM | POA: Diagnosis not present

## 2017-11-05 DIAGNOSIS — Z23 Encounter for immunization: Secondary | ICD-10-CM | POA: Diagnosis not present

## 2017-11-11 DIAGNOSIS — Z8546 Personal history of malignant neoplasm of prostate: Secondary | ICD-10-CM | POA: Diagnosis not present

## 2017-11-18 DIAGNOSIS — N5201 Erectile dysfunction due to arterial insufficiency: Secondary | ICD-10-CM | POA: Diagnosis not present

## 2017-11-18 DIAGNOSIS — Z8546 Personal history of malignant neoplasm of prostate: Secondary | ICD-10-CM | POA: Diagnosis not present

## 2018-02-10 DIAGNOSIS — E785 Hyperlipidemia, unspecified: Secondary | ICD-10-CM | POA: Diagnosis not present

## 2018-02-10 DIAGNOSIS — E119 Type 2 diabetes mellitus without complications: Secondary | ICD-10-CM | POA: Diagnosis not present

## 2018-02-17 DIAGNOSIS — E785 Hyperlipidemia, unspecified: Secondary | ICD-10-CM | POA: Diagnosis not present

## 2018-02-17 DIAGNOSIS — E119 Type 2 diabetes mellitus without complications: Secondary | ICD-10-CM | POA: Diagnosis not present

## 2018-02-17 DIAGNOSIS — D696 Thrombocytopenia, unspecified: Secondary | ICD-10-CM | POA: Diagnosis not present

## 2018-07-12 DIAGNOSIS — Z1211 Encounter for screening for malignant neoplasm of colon: Secondary | ICD-10-CM | POA: Diagnosis not present

## 2018-07-12 DIAGNOSIS — D125 Benign neoplasm of sigmoid colon: Secondary | ICD-10-CM | POA: Diagnosis not present

## 2018-07-12 DIAGNOSIS — K5730 Diverticulosis of large intestine without perforation or abscess without bleeding: Secondary | ICD-10-CM | POA: Diagnosis not present

## 2018-07-12 DIAGNOSIS — D124 Benign neoplasm of descending colon: Secondary | ICD-10-CM | POA: Diagnosis not present

## 2018-08-18 DIAGNOSIS — E785 Hyperlipidemia, unspecified: Secondary | ICD-10-CM | POA: Diagnosis not present

## 2018-08-18 DIAGNOSIS — Z125 Encounter for screening for malignant neoplasm of prostate: Secondary | ICD-10-CM | POA: Diagnosis not present

## 2018-08-18 DIAGNOSIS — R739 Hyperglycemia, unspecified: Secondary | ICD-10-CM | POA: Diagnosis not present

## 2018-08-18 DIAGNOSIS — I10 Essential (primary) hypertension: Secondary | ICD-10-CM | POA: Diagnosis not present

## 2018-08-18 DIAGNOSIS — Z0000 Encounter for general adult medical examination without abnormal findings: Secondary | ICD-10-CM | POA: Diagnosis not present

## 2018-08-25 DIAGNOSIS — R001 Bradycardia, unspecified: Secondary | ICD-10-CM | POA: Diagnosis not present

## 2018-08-25 DIAGNOSIS — Z0000 Encounter for general adult medical examination without abnormal findings: Secondary | ICD-10-CM | POA: Diagnosis not present

## 2018-08-25 DIAGNOSIS — M25552 Pain in left hip: Secondary | ICD-10-CM | POA: Diagnosis not present

## 2018-08-25 DIAGNOSIS — E119 Type 2 diabetes mellitus without complications: Secondary | ICD-10-CM | POA: Diagnosis not present

## 2019-02-09 DIAGNOSIS — Z8546 Personal history of malignant neoplasm of prostate: Secondary | ICD-10-CM | POA: Diagnosis not present

## 2019-02-16 DIAGNOSIS — N5201 Erectile dysfunction due to arterial insufficiency: Secondary | ICD-10-CM | POA: Diagnosis not present

## 2019-02-16 DIAGNOSIS — Z8546 Personal history of malignant neoplasm of prostate: Secondary | ICD-10-CM | POA: Diagnosis not present

## 2019-02-16 DIAGNOSIS — N3944 Nocturnal enuresis: Secondary | ICD-10-CM | POA: Diagnosis not present

## 2019-02-26 DIAGNOSIS — D696 Thrombocytopenia, unspecified: Secondary | ICD-10-CM | POA: Diagnosis not present

## 2019-02-26 DIAGNOSIS — E785 Hyperlipidemia, unspecified: Secondary | ICD-10-CM | POA: Diagnosis not present

## 2019-02-26 DIAGNOSIS — E119 Type 2 diabetes mellitus without complications: Secondary | ICD-10-CM | POA: Diagnosis not present

## 2019-03-12 DIAGNOSIS — F339 Major depressive disorder, recurrent, unspecified: Secondary | ICD-10-CM | POA: Diagnosis not present

## 2019-03-12 DIAGNOSIS — D696 Thrombocytopenia, unspecified: Secondary | ICD-10-CM | POA: Diagnosis not present

## 2019-03-12 DIAGNOSIS — Z8546 Personal history of malignant neoplasm of prostate: Secondary | ICD-10-CM | POA: Diagnosis not present

## 2019-03-12 DIAGNOSIS — E785 Hyperlipidemia, unspecified: Secondary | ICD-10-CM | POA: Diagnosis not present

## 2019-03-13 ENCOUNTER — Other Ambulatory Visit: Payer: Self-pay | Admitting: Internal Medicine

## 2019-03-13 DIAGNOSIS — R7401 Elevation of levels of liver transaminase levels: Secondary | ICD-10-CM

## 2019-03-20 ENCOUNTER — Other Ambulatory Visit: Payer: Self-pay

## 2019-03-26 ENCOUNTER — Other Ambulatory Visit: Payer: Self-pay

## 2019-03-26 ENCOUNTER — Ambulatory Visit
Admission: RE | Admit: 2019-03-26 | Discharge: 2019-03-26 | Disposition: A | Discharge status: Home / Self Care | Payer: BC Managed Care – PPO | Source: Ambulatory Visit | Attending: Internal Medicine | Admitting: Internal Medicine

## 2019-03-26 DIAGNOSIS — R748 Abnormal levels of other serum enzymes: Secondary | ICD-10-CM | POA: Diagnosis not present

## 2019-03-26 DIAGNOSIS — R7401 Elevation of levels of liver transaminase levels: Secondary | ICD-10-CM

## 2019-03-26 DIAGNOSIS — R161 Splenomegaly, not elsewhere classified: Secondary | ICD-10-CM | POA: Diagnosis not present

## 2019-06-02 ENCOUNTER — Ambulatory Visit (HOSPITAL_COMMUNITY)
Admission: EM | Admit: 2019-06-02 | Discharge: 2019-06-02 | Disposition: A | Discharge status: Home / Self Care | Payer: BC Managed Care – PPO | Attending: Urgent Care | Admitting: Urgent Care

## 2019-06-02 ENCOUNTER — Other Ambulatory Visit: Payer: Self-pay

## 2019-06-02 ENCOUNTER — Encounter (HOSPITAL_COMMUNITY): Payer: Self-pay

## 2019-06-02 DIAGNOSIS — H9203 Otalgia, bilateral: Secondary | ICD-10-CM | POA: Diagnosis not present

## 2019-06-02 DIAGNOSIS — T7029XA Other effects of high altitude, initial encounter: Secondary | ICD-10-CM

## 2019-06-02 MED ORDER — PSEUDOEPHEDRINE HCL 30 MG PO TABS: 60.0000 mg | ORAL_TABLET | Freq: Three times a day (TID) | ORAL | 0 refills | Status: DC | PRN

## 2019-06-02 MED ORDER — IBUPROFEN 600 MG PO TABS: 600.0000 mg | ORAL_TABLET | Freq: Three times a day (TID) | ORAL | 0 refills | Status: DC | PRN

## 2019-06-02 MED ORDER — CETIRIZINE HCL 10 MG PO TABS: 10.0000 mg | ORAL_TABLET | Freq: Every day | ORAL | 0 refills | Status: DC

## 2019-06-02 NOTE — ED Provider Notes (Signed)
[place]   MRN: [id number] DOB: 08-03-1955  Subjective:   Jason Keith is a 64 y.o. male presenting for bilateral ear fullness, ear popping and mild pain.  Patient went scuba diving this morning and had the symptoms thereafter.  Denies fever, drainage of pus or bleeding, sinus congestion, sore throat, cough, tinnitus, dizziness.  Has not tried medications for relief.  Denies history of high blood pressure, heart disease or heart conditions.  Has a history of allergic rhinitis.  Has not taken any antihistamine.  No current facility-administered medications for this encounter.  Current Outpatient Medications:  .  acetaminophen (TYLENOL) 500 MG tablet, Take 1 tablet (500 mg total) by mouth every 6 (six) hours as needed., Disp: 30 tablet, Rfl: 0 .  aspirin 81 MG tablet, Take 81 mg by mouth daily., Disp: , Rfl:  .  metFORMIN (GLUCOPHAGE-XR) 500 MG 24 hr tablet, Take 1 tablet (500 mg total) by mouth daily with breakfast. (Patient taking differently: Take 500 mg by mouth 2 (two) times daily. ), Disp: 90 tablet, Rfl: 0 .  Multiple Vitamins-Minerals (MULTIVITAMIN WITH MINERALS) tablet, Take 1 tablet by mouth daily., Disp: , Rfl:  .  pravastatin (PRAVACHOL) 40 MG tablet, Take 1 tablet (40 mg total) by mouth daily., Disp: 90 tablet, Rfl: 2 .  tamsulosin (FLOMAX) 0.4 MG CAPS capsule, Take 0.4 mg by mouth., Disp: , Rfl:  .  traZODone (DESYREL) 100 MG tablet, TAKE 1 TABLET BY MOUTH EVERYDAY AT BEDTIME, Disp: , Rfl: 1   No Known Allergies  Past Medical History:  Diagnosis Date  . Allergic rhinitis   . DM (diabetes mellitus), type 2 ([place])   . Dyslipidemia   . HTN (hypertension)   . Renal stones      Past Surgical History:  Procedure Laterality Date  . PROSTATECTOMY  2011   [place]    No family history on file.  Social History   Tobacco Use  . Smoking status: Never Smoker  . Smokeless tobacco: Never Used  Substance Use Topics  . Alcohol use: No  . Drug use: No     ROS   Objective:   Vitals: BP 127/73 (BP Location: Right Arm)   Pulse 82   Temp 98.6 F (37 C) (Oral)   Resp 18   SpO2 100%   Physical Exam Constitutional:      General: He is not in acute distress.    Appearance: Normal appearance. He is normal weight. He is not ill-appearing.  HENT:     Head: Normocephalic and atraumatic.     Right Ear: Ear canal and external ear normal. There is no impacted cerumen.     Left Ear: Ear canal and external ear normal. There is no impacted cerumen.     Ears:     Comments: TMs injected bilaterally.  However, both are intact.    Nose: Nose normal. No congestion or rhinorrhea.     Mouth/Throat:     Mouth: Mucous membranes are moist.     Pharynx: Oropharynx is clear. No oropharyngeal exudate or posterior oropharyngeal erythema.  Eyes:     General: No scleral icterus.       Right eye: No discharge.        Left eye: No discharge.     Extraocular Movements: Extraocular movements intact.     Conjunctiva/sclera: Conjunctivae normal.     Pupils: Pupils are equal, round, and reactive to light.  Cardiovascular:     Rate and Rhythm: Normal rate.  Pulmonary:  Effort: Pulmonary effort is normal.  Musculoskeletal:     Cervical back: Normal range of motion and neck supple. No rigidity. No muscular tenderness.  Neurological:     General: No focal deficit present.     Mental Status: He is alert and oriented to person, place, and time.  Psychiatric:        Mood and Affect: Mood normal.        Behavior: Behavior normal.      Assessment and Plan :   PDMP not reviewed this encounter.  1. Acute ear pain, bilateral   2. Barotrauma, initial encounter     Counseled on general management of his barotrauma secondary to the scuba diving.  Recommended naproxen for pain, antihistamine and decongestant. Counseled patient on potential for adverse effects with medications prescribed/recommended today, ER and return-to-clinic precautions discussed,  patient verbalized understanding.    Wallis Bamberg, PA-C 06/03/19 1008

## 2019-06-02 NOTE — ED Triage Notes (Signed)
Pt states he went scuba diving this morning and pt thinks he injured his ears.

## 2019-09-05 DIAGNOSIS — E785 Hyperlipidemia, unspecified: Secondary | ICD-10-CM | POA: Diagnosis not present

## 2019-09-05 DIAGNOSIS — R739 Hyperglycemia, unspecified: Secondary | ICD-10-CM | POA: Diagnosis not present

## 2019-09-05 DIAGNOSIS — Z125 Encounter for screening for malignant neoplasm of prostate: Secondary | ICD-10-CM | POA: Diagnosis not present

## 2019-09-05 DIAGNOSIS — I10 Essential (primary) hypertension: Secondary | ICD-10-CM | POA: Diagnosis not present

## 2019-09-05 DIAGNOSIS — E119 Type 2 diabetes mellitus without complications: Secondary | ICD-10-CM | POA: Diagnosis not present

## 2019-09-05 DIAGNOSIS — E039 Hypothyroidism, unspecified: Secondary | ICD-10-CM | POA: Diagnosis not present

## 2019-09-28 DIAGNOSIS — E119 Type 2 diabetes mellitus without complications: Secondary | ICD-10-CM | POA: Diagnosis not present

## 2019-09-28 DIAGNOSIS — Z0000 Encounter for general adult medical examination without abnormal findings: Secondary | ICD-10-CM | POA: Diagnosis not present

## 2019-09-28 DIAGNOSIS — D696 Thrombocytopenia, unspecified: Secondary | ICD-10-CM | POA: Diagnosis not present

## 2019-09-28 DIAGNOSIS — E785 Hyperlipidemia, unspecified: Secondary | ICD-10-CM | POA: Diagnosis not present

## 2019-09-28 DIAGNOSIS — Z23 Encounter for immunization: Secondary | ICD-10-CM | POA: Diagnosis not present

## 2019-11-23 DIAGNOSIS — N486 Induration penis plastica: Secondary | ICD-10-CM | POA: Diagnosis not present

## 2019-11-23 DIAGNOSIS — Z8546 Personal history of malignant neoplasm of prostate: Secondary | ICD-10-CM | POA: Diagnosis not present

## 2020-01-25 DIAGNOSIS — E119 Type 2 diabetes mellitus without complications: Secondary | ICD-10-CM | POA: Diagnosis not present

## 2020-01-25 DIAGNOSIS — D696 Thrombocytopenia, unspecified: Secondary | ICD-10-CM | POA: Diagnosis not present

## 2020-01-25 DIAGNOSIS — E785 Hyperlipidemia, unspecified: Secondary | ICD-10-CM | POA: Diagnosis not present

## 2020-02-08 DIAGNOSIS — D696 Thrombocytopenia, unspecified: Secondary | ICD-10-CM | POA: Diagnosis not present

## 2020-02-08 DIAGNOSIS — E119 Type 2 diabetes mellitus without complications: Secondary | ICD-10-CM | POA: Diagnosis not present

## 2020-02-08 DIAGNOSIS — E785 Hyperlipidemia, unspecified: Secondary | ICD-10-CM | POA: Diagnosis not present

## 2020-02-08 DIAGNOSIS — Z8546 Personal history of malignant neoplasm of prostate: Secondary | ICD-10-CM | POA: Diagnosis not present

## 2020-07-28 DIAGNOSIS — D696 Thrombocytopenia, unspecified: Secondary | ICD-10-CM | POA: Diagnosis not present

## 2020-07-28 DIAGNOSIS — E785 Hyperlipidemia, unspecified: Secondary | ICD-10-CM | POA: Diagnosis not present

## 2020-07-28 DIAGNOSIS — E119 Type 2 diabetes mellitus without complications: Secondary | ICD-10-CM | POA: Diagnosis not present

## 2020-08-06 IMAGING — US US ABDOMEN COMPLETE
1 series · 13 of 25 positions shown · non-contrast
Comparison: None.

CLINICAL DATA: Elevated liver enzymes

EXAM:
ABDOMEN ULTRASOUND COMPLETE

[Series 1: us abdomen complete · 0.26mm/px · 13 of 62 slices shown]
[im 1/62]
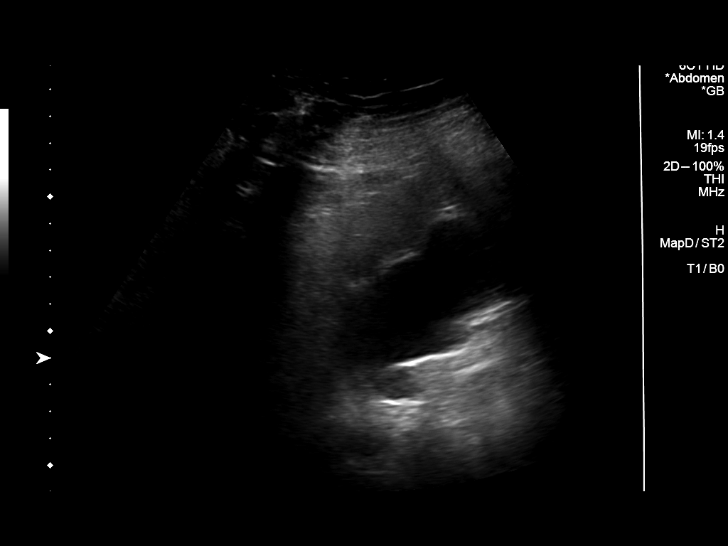
[im 6/62]
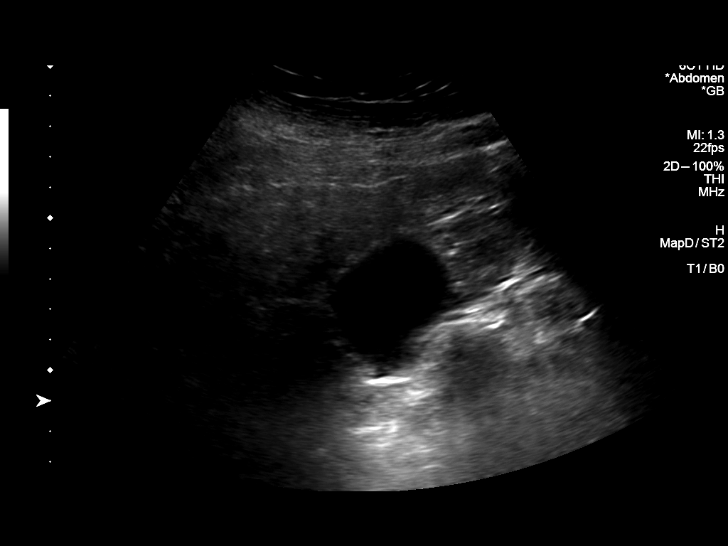
[im 11/62]
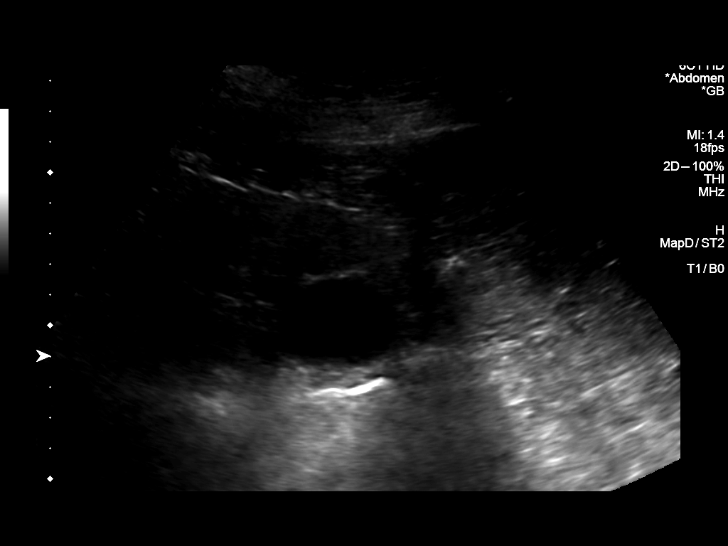
[im 16/62]
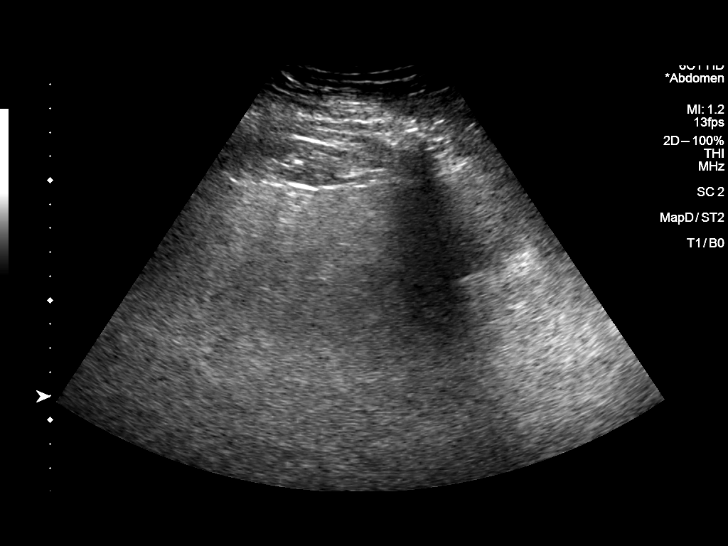
[im 21/62]
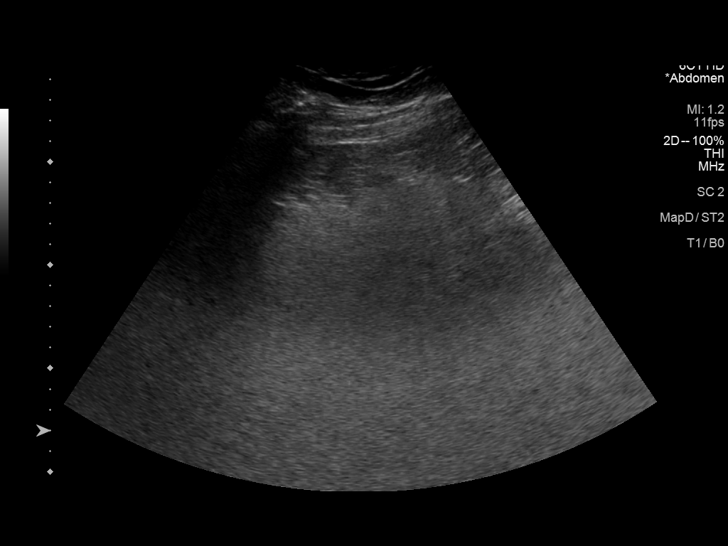
[im 26/62]
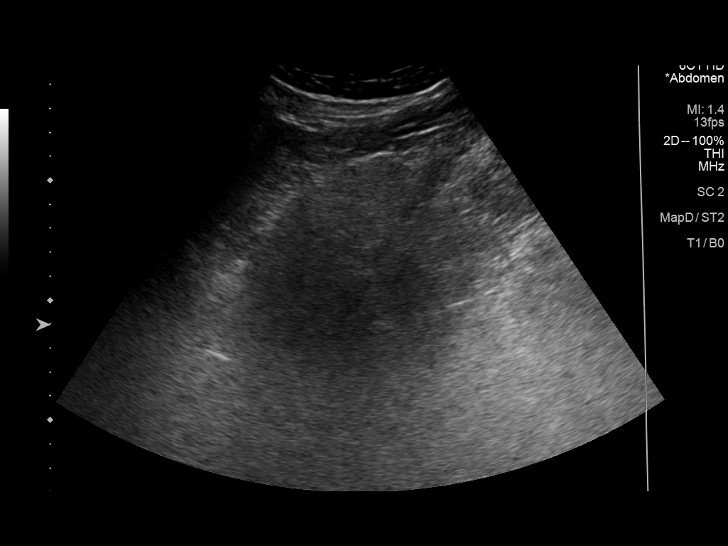
[im 31/62]
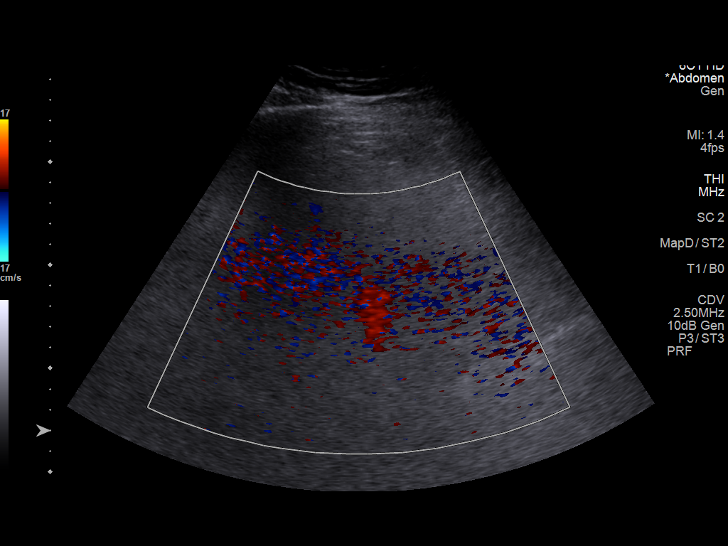
[im 36/62]
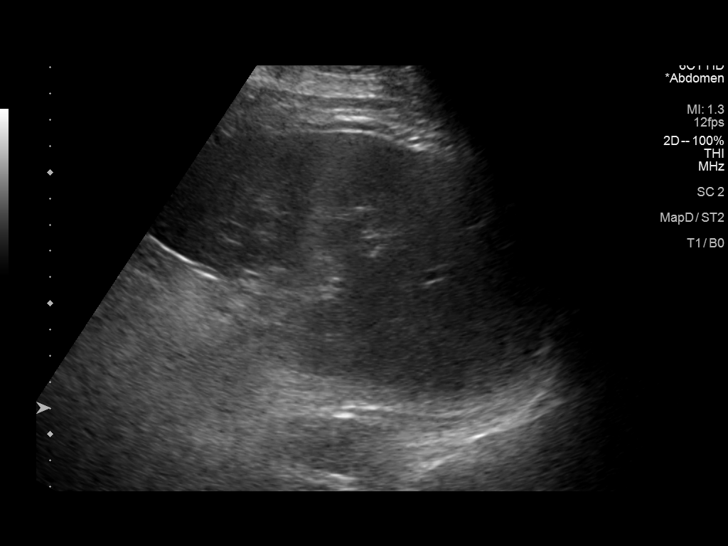
[im 41/62]
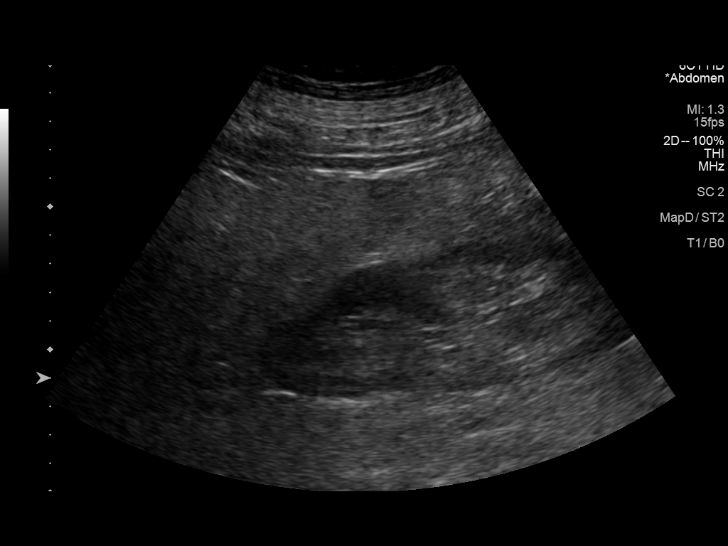
[im 46/62]
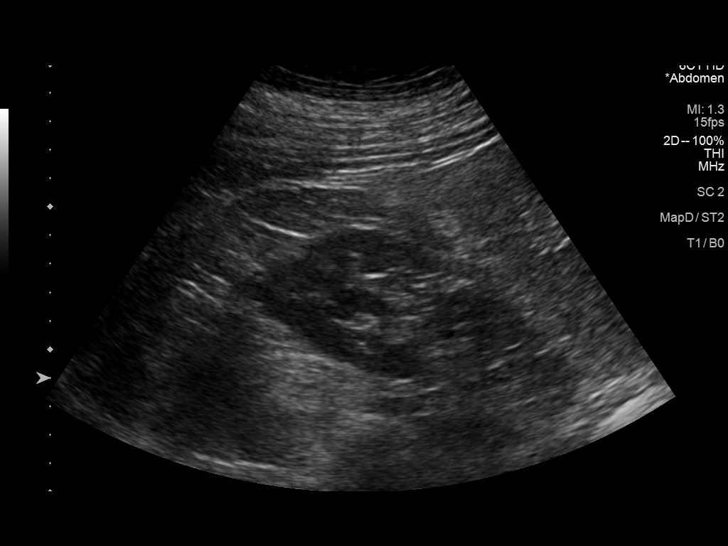
[im 51/62]
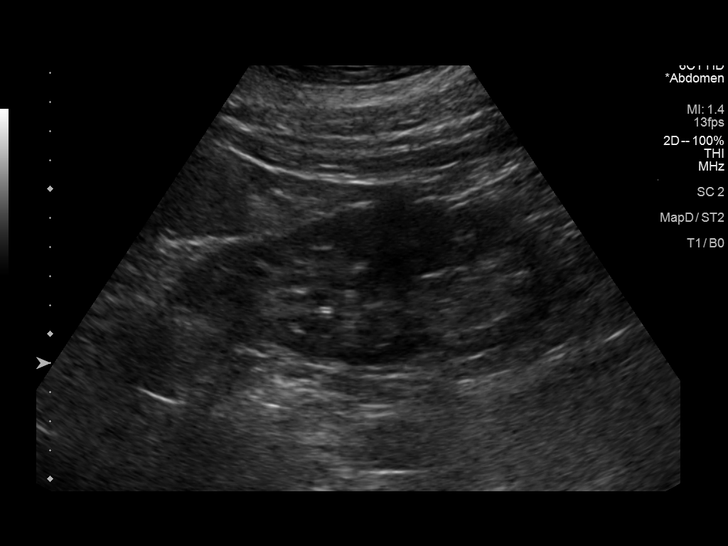
[im 56/62]
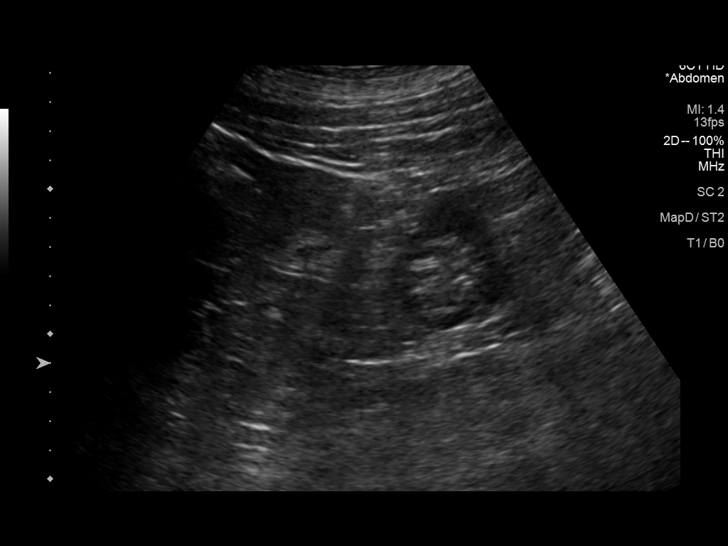
[im 62/62]
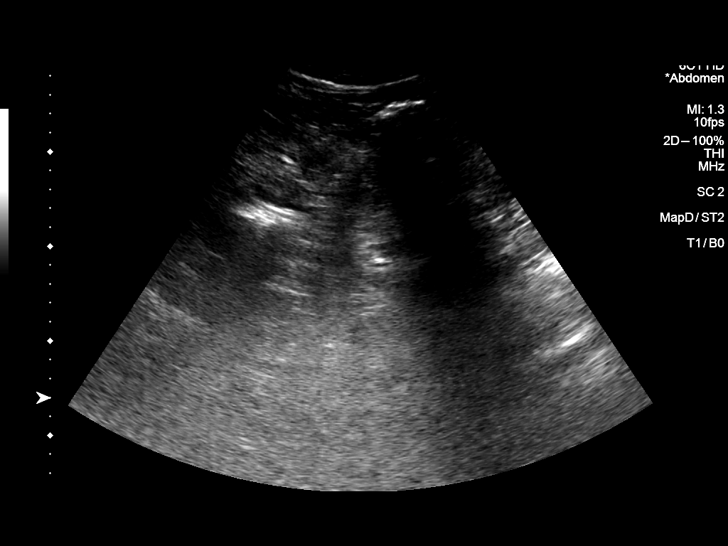

[13 of 25 positions shown; findings below may reference images not displayed]

FINDINGS: Gallbladder: No gallstones or wall thickening visualized. There is
no pericholecystic fluid. No sonographic Murphy sign noted by
sonographer.

Common bile duct: Diameter: 4 mm. No intrahepatic, common hepatic,
or common bile duct dilatation.

Liver: No focal lesion identified. Liver echogenicity is increased
diffusely. Portal vein is patent on color Doppler imaging with
normal direction of blood flow towards the liver. Note that portal
venous evaluation is limited due to the increase in liver
echogenicity limiting sound transmission in this area.

IVC: Inferior vena cava obscured by combination of diffuse increase
in liver echogenicity and bowel gas.

Pancreas: Completely obscured by gas.

Spleen: Spleen measures 16.4 x 16.3 x 9.5 cm with a measured splenic
volume of 1,329 cubic cm. No focal splenic lesions are evident.

Right Kidney: Length: 14.3 cm. Echogenicity within normal limits. No
mass or hydronephrosis visualized.

Left Kidney: Length: 14.6 cm. Echogenicity within normal limits. No
mass or hydronephrosis visualized.

Abdominal aorta: No aneurysm visualized.

Other findings: No demonstrable ascites.
IMPRESSION: 1. Diffuse increase in liver echogenicity, a finding indicative of
hepatic steatosis with potential underlying parenchymal liver
disease. No focal liver lesions are evident; it must be cautioned
that the sensitivity of ultrasound for detection of focal liver
lesions is diminished significantly in this circumstance.

2.  Splenomegaly.  No focal splenic lesions evident.

3. Pancreas and inferior vena cava obscured by gas. Limited
visualization of portal veins.

4.  Study otherwise unremarkable.

## 2020-08-08 DIAGNOSIS — Z8546 Personal history of malignant neoplasm of prostate: Secondary | ICD-10-CM | POA: Diagnosis not present

## 2020-08-08 DIAGNOSIS — D696 Thrombocytopenia, unspecified: Secondary | ICD-10-CM | POA: Diagnosis not present

## 2020-08-08 DIAGNOSIS — E119 Type 2 diabetes mellitus without complications: Secondary | ICD-10-CM | POA: Diagnosis not present

## 2020-08-08 DIAGNOSIS — E785 Hyperlipidemia, unspecified: Secondary | ICD-10-CM | POA: Diagnosis not present

## 2021-02-03 DIAGNOSIS — Z125 Encounter for screening for malignant neoplasm of prostate: Secondary | ICD-10-CM | POA: Diagnosis not present

## 2021-02-03 DIAGNOSIS — D696 Thrombocytopenia, unspecified: Secondary | ICD-10-CM | POA: Diagnosis not present

## 2021-02-03 DIAGNOSIS — Z8546 Personal history of malignant neoplasm of prostate: Secondary | ICD-10-CM | POA: Diagnosis not present

## 2021-02-03 DIAGNOSIS — E119 Type 2 diabetes mellitus without complications: Secondary | ICD-10-CM | POA: Diagnosis not present

## 2021-02-03 DIAGNOSIS — E785 Hyperlipidemia, unspecified: Secondary | ICD-10-CM | POA: Diagnosis not present

## 2021-02-03 DIAGNOSIS — R635 Abnormal weight gain: Secondary | ICD-10-CM | POA: Diagnosis not present

## 2021-02-10 DIAGNOSIS — M25512 Pain in left shoulder: Secondary | ICD-10-CM | POA: Diagnosis not present

## 2021-02-10 DIAGNOSIS — R2689 Other abnormalities of gait and mobility: Secondary | ICD-10-CM | POA: Diagnosis not present

## 2021-02-10 DIAGNOSIS — E119 Type 2 diabetes mellitus without complications: Secondary | ICD-10-CM | POA: Diagnosis not present

## 2021-02-10 DIAGNOSIS — G4700 Insomnia, unspecified: Secondary | ICD-10-CM | POA: Diagnosis not present

## 2021-02-10 DIAGNOSIS — Z23 Encounter for immunization: Secondary | ICD-10-CM | POA: Diagnosis not present

## 2021-02-10 DIAGNOSIS — Z0000 Encounter for general adult medical examination without abnormal findings: Secondary | ICD-10-CM | POA: Diagnosis not present

## 2021-02-10 DIAGNOSIS — R079 Chest pain, unspecified: Secondary | ICD-10-CM | POA: Diagnosis not present

## 2021-02-16 DIAGNOSIS — M7542 Impingement syndrome of left shoulder: Secondary | ICD-10-CM | POA: Diagnosis not present

## 2021-02-16 DIAGNOSIS — M19012 Primary osteoarthritis, left shoulder: Secondary | ICD-10-CM | POA: Diagnosis not present

## 2021-03-04 ENCOUNTER — Encounter: Payer: Self-pay | Admitting: Cardiovascular Disease

## 2021-03-04 ENCOUNTER — Ambulatory Visit (INDEPENDENT_AMBULATORY_CARE_PROVIDER_SITE_OTHER): Payer: BC Managed Care – PPO | Admitting: Cardiovascular Disease

## 2021-03-04 ENCOUNTER — Other Ambulatory Visit: Payer: Self-pay

## 2021-03-04 VITALS — BP 136/78 | HR 69 | Ht 74.0 in | Wt 305.6 lb

## 2021-03-04 DIAGNOSIS — R072 Precordial pain: Secondary | ICD-10-CM

## 2021-03-04 DIAGNOSIS — E785 Hyperlipidemia, unspecified: Secondary | ICD-10-CM | POA: Diagnosis not present

## 2021-03-04 DIAGNOSIS — G4733 Obstructive sleep apnea (adult) (pediatric): Secondary | ICD-10-CM | POA: Insufficient documentation

## 2021-03-04 DIAGNOSIS — Z8249 Family history of ischemic heart disease and other diseases of the circulatory system: Secondary | ICD-10-CM

## 2021-03-04 DIAGNOSIS — R0789 Other chest pain: Secondary | ICD-10-CM

## 2021-03-04 MED ORDER — METOPROLOL TARTRATE 100 MG PO TABS: 100.0000 mg | ORAL_TABLET | Freq: Once | ORAL | 0 refills | Status: AC

## 2021-03-04 MED ORDER — ATORVASTATIN CALCIUM 40 MG PO TABS: 40.0000 mg | ORAL_TABLET | Freq: Every day | ORAL | 3 refills | Status: AC

## 2021-03-04 NOTE — Assessment & Plan Note (Signed)
Father had MI in his 18s.

## 2021-03-04 NOTE — Patient Instructions (Addendum)
Medication Instructions:   -Stop Pravachol (pravastatin).  -Start atorvastatin (lipitor) 40mg  once daily.  *If you need a refill on your cardiac medications before your next appointment, please call your pharmacy*   Lab Work: Your physician recommends that you return for lab work in: within 7 days of coronary CTA- BMET  Your physician recommends that you return for lab work in: 3 months for FASTING lipid/liver profile   If you have labs (blood work) drawn today and your tests are completely normal, you will receive your results only by: MyChart Message (if you have MyChart) OR A paper copy in the mail If you have any lab test that is abnormal or we need to change your treatment, we will call you to review the results.   Testing/Procedures: Your physician has recommended that you have a sleep study. This test records several body functions during sleep, including: brain activity, eye movement, oxygen and carbon dioxide blood levels, heart rate and rhythm, breathing rate and rhythm, the flow of air through your mouth and nose, snoring, body muscle movements, and chest and belly movement.    Follow-Up: At [facility], you and your health needs are our priority.  As part of our continuing mission to provide you with exceptional heart care, we have created designated Provider Care Teams.  These Care Teams include your primary Cardiologist (physician) and Advanced Practice Providers (APPs -  Physician Assistants and Nurse Practitioners) who all work together to provide you with the care you need, when you need it.  We recommend signing up for the patient portal called "MyChart".  Sign up information is provided on this After Visit Summary.  MyChart is used to connect with patients for Virtual Visits (Telemedicine).  Patients are able to view lab/test results, encounter notes, upcoming appointments, etc.  Non-urgent messages can be sent to your provider as well.   To learn more about what  you can do with MyChart, go to [facility].    Your next appointment:   6 month(s)  The format for your next appointment:   In Person  Provider:   [email or web address], MD  Other Instructions   Your cardiac CT will be scheduled at one of the below locations:   [facility] [address] [place], [place] [phone]  OR  [facility] [address] [place], [place] [phone]  If scheduled at [facility], please arrive at the [facility] main entrance (entrance A) of [facility] 30 minutes prior to test start time. You can use the FREE valet parking offered at the main entrance (encouraged to control the heart rate for the test) Proceed to the [facility] Radiology Department (first floor) to check-in and test prep.  If scheduled at [facility], please arrive 15 mins early for check-in and test prep.  Please follow these instructions carefully (unless otherwise directed):  Hold all erectile dysfunction medications at least 3 days (72 hrs) prior to test.  On the Night Before the Test: Be sure to Drink plenty of water. Do not consume any caffeinated/decaffeinated beverages or chocolate 12 hours prior to your test. Do not take any antihistamines 12 hours prior to your test.   On the Day of the Test: Drink plenty of water until 1 hour prior to the test. Do not eat any food 4 hours prior to the test. You may take your regular medications prior to the test.  Take metoprolol (Lopressor)  100mg  two hours prior to test. HOLD Furosemide/Hydrochlorothiazide morning of the test.      After the Test: Drink plenty of water. After receiving IV contrast, you may experience a mild flushed feeling. This is normal. On occasion, you may experience a mild rash up to 24 hours after the test. This is not dangerous. If this occurs, you can take Benadryl 25 mg  and increase your fluid intake. If you experience trouble breathing, this can be serious. If it is severe call 911 IMMEDIATELY. If it is mild, please call our office. If you take any of these medications: Glipizide/Metformin, Avandament, Glucavance, please do not take 48 hours after completing test unless otherwise instructed.  We will call to schedule your test 2-4 weeks out understanding that some insurance companies will need an authorization prior to the service being performed.   For non-scheduling related questions, please contact the cardiac imaging nurse navigator should you have any questions/concerns: , Cardiac Imaging Nurse Navigator Rockwell Alexandria, Cardiac Imaging Nurse Navigator [facility] Direct Office Dial: [phone]   For scheduling needs, including cancellations and rescheduling, please call [phone], [phone].

## 2021-03-04 NOTE — Assessment & Plan Note (Signed)
Several year history of atypical chest pain which she has not had in several months.  It is usually exertional or occurs at times of stress.  Does have positive cardiac risk factors.  I am going to get a coronary CTA to further evaluate.

## 2021-03-04 NOTE — Progress Notes (Signed)
03/04/2021 HAILEY GRAHAM   11-05-1955  [id number]  Primary Physician Pearson Grippe, MD Primary Cardiologist: Runell Gess MD Nicholes Calamity, [place]  HPI:  GAYLOR RAWLINGS is a 66 y.o. morbidly overweight married Caucasian male father of 1, grandfather of 2 grandchildren referred by Dr. Irena Reichmann for evaluation of chest pain.  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  History factors include treated hyperlipidemia and diabetes.  His father did have a heart attack in his 61s.  Patient is never had a heart attack or stroke.  He is had some chest pain in the past with exertion but none in the last several months although he really has not exercise much lately either.  He does get his symptoms compatible with obstructive sleep apnea.   Current Meds  Medication Sig   aspirin 81 MG tablet Take 81 mg by mouth daily.   FLUoxetine (PROZAC) 20 MG capsule fluoxetine 20 mg capsule  TAKE 1 CAPSULE IN THE MORNING ORALLY ONCE A DAY 90   metFORMIN (GLUCOPHAGE-XR) 500 MG 24 hr tablet Take 1 tablet (500 mg total) by mouth daily with breakfast. (Patient taking differently: Take 500 mg by mouth 2 (two) times daily.)   Multiple Vitamins-Minerals (MULTIVITAMIN WITH MINERALS) tablet Take 1 tablet by mouth daily.   pravastatin (PRAVACHOL) 40 MG tablet Take 1 tablet (40 mg total) by mouth daily.   Semaglutide, 1 MG/DOSE, (OZEMPIC, 1 MG/DOSE,) 4 MG/3ML SOPN Ozempic 1 mg/dose (4 mg/3 mL) subcutaneous pen injector  INJECT 1MG  WEEKLY AS DIRECTED   tamsulosin (FLOMAX) 0.4 MG CAPS capsule Take 0.4 mg by mouth.     No Known Allergies  Social History   Socioeconomic History   Marital status: Married    Spouse name: Not on file   Number of children: Not on file   Years of education: Not on file   Highest education level: Not on file  Occupational History   Not on file  Tobacco Use   Smoking status: Never   Smokeless tobacco: Never  Substance and Sexual Activity   Alcohol use: No   Drug use: No    Sexual activity: Yes  Other Topics Concern   Not on file  Social History Narrative   Not on file   Social Determinants of Health   Financial Resource Strain: Not on file  Food Insecurity: Not on file  Transportation Needs: Not on file  Physical Activity: Not on file  Stress: Not on file  Social Connections: Not on file  Intimate Partner Violence: Not on file     Review of Systems: General: negative for chills, fever, night sweats or weight changes.  Cardiovascular: negative for chest pain, dyspnea on exertion, edema, orthopnea, palpitations, paroxysmal nocturnal dyspnea or shortness of breath Dermatological: negative for rash Respiratory: negative for cough or wheezing Urologic: negative for hematuria Abdominal: negative for nausea, vomiting, diarrhea, bright red blood per rectum, melena, or hematemesis Neurologic: negative for visual changes, syncope, or dizziness All other systems reviewed and are otherwise negative except as noted above.    Blood pressure 136/78, pulse 69, height 6\' 2"  (1.88 m), weight (!) 305 lb 9.6 oz (138.6 kg), SpO2 99 %.  General appearance: alert and no distress Neck: no adenopathy, no carotid bruit, no JVD, supple, symmetrical, trachea midline, and thyroid not enlarged, symmetric, no tenderness/mass/nodules Lungs: clear to auscultation bilaterally Heart: regular rate and rhythm, S1, S2 normal, no murmur, click, rub or gallop Extremities: extremities normal, atraumatic, no  cyanosis or edema Pulses: 2+ and symmetric Skin: Skin color, texture, turgor normal. No rashes or lesions Neurologic: Grossly normal  EKG sinus rhythm at 69 with incomplete right bundle branch block.  I personally reviewed this EKG.  ASSESSMENT AND PLAN:   Hyperlipidemia LDL goal <70 History of hyperlipidemia on statin therapy with lipid profile performed 07/28/2020 revealing a total cholesterol of 165, LDL of 127 and HDL of 39.  He is not at goal for primary prevention.  I am  going to switch his statin from Pravachol to atorvastatin 40 mg a day and we will recheck a lipid liver profile in 3 months.  Obstructive sleep apnea Patient with morbid obesity, nocturnal snoring and daytime somnolence.  I suspect he has obstructive sleep apnea.  I am going to get a home sleep study.  Atypical chest pain Several year history of atypical chest pain which she has not had in several months.  It is usually exertional or occurs at times of stress.  Does have positive cardiac risk factors.  I am going to get a coronary CTA to further evaluate.  Family history of heart disease Father had MI in his 59s.     Lorretta Harp MD FACP,FACC,FAHA, [facility] 03/04/2021 11:14 AM

## 2021-03-04 NOTE — Assessment & Plan Note (Addendum)
Patient with morbid obesity, nocturnal snoring and daytime somnolence.  I suspect he has obstructive sleep apnea.  I am going to get a home sleep study.  His Epworth score was 11.

## 2021-03-04 NOTE — Assessment & Plan Note (Signed)
History of hyperlipidemia on statin therapy with lipid profile performed 07/28/2020 revealing a total cholesterol of 165, LDL of 127 and HDL of 39.  He is not at goal for primary prevention.  I am going to switch his statin from Pravachol to atorvastatin 40 mg a day and we will recheck a lipid liver profile in 3 months.

## 2021-03-12 ENCOUNTER — Encounter (HOSPITAL_COMMUNITY): Payer: Self-pay

## 2021-03-12 ENCOUNTER — Telehealth (HOSPITAL_COMMUNITY): Payer: Self-pay | Admitting: Emergency Medicine

## 2021-03-12 DIAGNOSIS — R0789 Other chest pain: Secondary | ICD-10-CM | POA: Diagnosis not present

## 2021-03-12 DIAGNOSIS — G4733 Obstructive sleep apnea (adult) (pediatric): Secondary | ICD-10-CM | POA: Diagnosis not present

## 2021-03-12 DIAGNOSIS — R072 Precordial pain: Secondary | ICD-10-CM | POA: Diagnosis not present

## 2021-03-12 DIAGNOSIS — E785 Hyperlipidemia, unspecified: Secondary | ICD-10-CM | POA: Diagnosis not present

## 2021-03-12 NOTE — Telephone Encounter (Signed)
Reaching out to patient to offer assistance regarding upcoming cardiac imaging study; pt verbalizes understanding of appt date/time, parking situation and where to check in, pre-test NPO status and medications ordered, and verified current allergies; name and call back number provided for further questions should they arise ?Rockwell Alexandria RN Navigator Cardiac Imaging ?[facility] ?[phone] office ?[phone] cell ? ?Reminded patient to get labs ?100mg  metoprolol  ?Denies iv issues ?Arrival 330 ?

## 2021-03-13 LAB — BASIC METABOLIC PANEL
BUN/Creatinine Ratio: 18 (ref 10–24)
BUN: 17 mg/dL (ref 8–27)
CO2: 23 mmol/L (ref 20–29)
Calcium: 9.5 mg/dL (ref 8.6–10.2)
Chloride: 99 mmol/L (ref 96–106)
Creatinine, Ser: 0.93 mg/dL (ref 0.76–1.27)
Glucose: 118 mg/dL — ABNORMAL HIGH (ref 70–99)
Potassium: 4.6 mmol/L (ref 3.5–5.2)
Sodium: 140 mmol/L (ref 134–144)
eGFR: 92 mL/min/{1.73_m2} (ref >59)

## 2021-03-16 ENCOUNTER — Other Ambulatory Visit: Payer: Self-pay

## 2021-03-16 ENCOUNTER — Ambulatory Visit (HOSPITAL_COMMUNITY)
Admission: RE | Admit: 2021-03-16 | Discharge: 2021-03-16 | Disposition: A | Discharge status: Home / Self Care | Payer: BC Managed Care – PPO | Source: Ambulatory Visit | Attending: Cardiovascular Disease | Admitting: Cardiovascular Disease

## 2021-03-16 DIAGNOSIS — R072 Precordial pain: Secondary | ICD-10-CM | POA: Insufficient documentation

## 2021-03-16 MED ORDER — NITROGLYCERIN 0.4 MG SL SUBL
0.8000 mg | SUBLINGUAL_TABLET | Freq: Once | SUBLINGUAL | Status: AC
  Administered 2021-03-16: 0.8 mg via SUBLINGUAL

## 2021-03-16 MED ORDER — NITROGLYCERIN 0.4 MG SL SUBL
SUBLINGUAL_TABLET | SUBLINGUAL | Status: AC
  Filled 2021-03-16: qty 2

## 2021-03-16 MED ORDER — IOHEXOL 350 MG/ML SOLN
95.0000 mL | Freq: Once | INTRAVENOUS | Status: AC | PRN
  Administered 2021-03-16: 95 mL via INTRAVENOUS

## 2021-03-30 DIAGNOSIS — M79605 Pain in left leg: Secondary | ICD-10-CM | POA: Diagnosis not present

## 2021-03-30 DIAGNOSIS — L0390 Cellulitis, unspecified: Secondary | ICD-10-CM | POA: Diagnosis not present

## 2021-04-02 DIAGNOSIS — L0390 Cellulitis, unspecified: Secondary | ICD-10-CM | POA: Diagnosis not present

## 2021-04-02 DIAGNOSIS — M79605 Pain in left leg: Secondary | ICD-10-CM | POA: Diagnosis not present

## 2021-04-10 DIAGNOSIS — E785 Hyperlipidemia, unspecified: Secondary | ICD-10-CM | POA: Diagnosis not present

## 2021-04-10 DIAGNOSIS — L0390 Cellulitis, unspecified: Secondary | ICD-10-CM | POA: Diagnosis not present

## 2021-04-10 DIAGNOSIS — E119 Type 2 diabetes mellitus without complications: Secondary | ICD-10-CM | POA: Diagnosis not present

## 2021-04-10 DIAGNOSIS — Z23 Encounter for immunization: Secondary | ICD-10-CM | POA: Diagnosis not present

## 2021-04-10 DIAGNOSIS — D696 Thrombocytopenia, unspecified: Secondary | ICD-10-CM | POA: Diagnosis not present

## 2021-07-17 DIAGNOSIS — E119 Type 2 diabetes mellitus without complications: Secondary | ICD-10-CM | POA: Diagnosis not present

## 2021-07-17 DIAGNOSIS — D696 Thrombocytopenia, unspecified: Secondary | ICD-10-CM | POA: Diagnosis not present

## 2021-07-17 DIAGNOSIS — E785 Hyperlipidemia, unspecified: Secondary | ICD-10-CM | POA: Diagnosis not present

## 2021-07-24 DIAGNOSIS — E1140 Type 2 diabetes mellitus with diabetic neuropathy, unspecified: Secondary | ICD-10-CM | POA: Diagnosis not present

## 2021-07-24 DIAGNOSIS — R2689 Other abnormalities of gait and mobility: Secondary | ICD-10-CM | POA: Diagnosis not present

## 2021-07-24 DIAGNOSIS — E785 Hyperlipidemia, unspecified: Secondary | ICD-10-CM | POA: Diagnosis not present

## 2021-07-24 DIAGNOSIS — D696 Thrombocytopenia, unspecified: Secondary | ICD-10-CM | POA: Diagnosis not present

## 2021-11-27 ENCOUNTER — Other Ambulatory Visit: Payer: Self-pay

## 2021-11-27 MED ORDER — OZEMPIC (2 MG/DOSE) 8 MG/3ML ~~LOC~~ SOPN
2.0000 mg | PEN_INJECTOR | SUBCUTANEOUS | 3 refills | Status: DC
  Filled 2021-11-27: qty 3, 28d supply, fill #0
  Filled 2021-12-22: qty 3, 28d supply, fill #1

## 2021-12-22 ENCOUNTER — Other Ambulatory Visit: Payer: Self-pay

## 2021-12-24 ENCOUNTER — Other Ambulatory Visit: Payer: Self-pay

## 2021-12-29 DIAGNOSIS — M25512 Pain in left shoulder: Secondary | ICD-10-CM | POA: Diagnosis not present

## 2021-12-29 DIAGNOSIS — W19XXXA Unspecified fall, initial encounter: Secondary | ICD-10-CM | POA: Diagnosis not present

## 2021-12-29 DIAGNOSIS — R0789 Other chest pain: Secondary | ICD-10-CM | POA: Diagnosis not present

## 2022-02-05 DIAGNOSIS — E1140 Type 2 diabetes mellitus with diabetic neuropathy, unspecified: Secondary | ICD-10-CM | POA: Diagnosis not present

## 2022-02-05 DIAGNOSIS — Z125 Encounter for screening for malignant neoplasm of prostate: Secondary | ICD-10-CM | POA: Diagnosis not present

## 2022-02-05 DIAGNOSIS — D696 Thrombocytopenia, unspecified: Secondary | ICD-10-CM | POA: Diagnosis not present

## 2022-02-05 DIAGNOSIS — R2689 Other abnormalities of gait and mobility: Secondary | ICD-10-CM | POA: Diagnosis not present

## 2022-02-05 DIAGNOSIS — E785 Hyperlipidemia, unspecified: Secondary | ICD-10-CM | POA: Diagnosis not present

## 2022-02-12 DIAGNOSIS — Z0000 Encounter for general adult medical examination without abnormal findings: Secondary | ICD-10-CM | POA: Diagnosis not present

## 2022-02-12 DIAGNOSIS — N529 Male erectile dysfunction, unspecified: Secondary | ICD-10-CM | POA: Diagnosis not present

## 2022-02-12 DIAGNOSIS — Z8546 Personal history of malignant neoplasm of prostate: Secondary | ICD-10-CM | POA: Diagnosis not present

## 2022-02-12 DIAGNOSIS — E1140 Type 2 diabetes mellitus with diabetic neuropathy, unspecified: Secondary | ICD-10-CM | POA: Diagnosis not present

## 2022-03-22 DIAGNOSIS — H2513 Age-related nuclear cataract, bilateral: Secondary | ICD-10-CM | POA: Diagnosis not present

## 2022-03-22 DIAGNOSIS — H25043 Posterior subcapsular polar age-related cataract, bilateral: Secondary | ICD-10-CM | POA: Diagnosis not present

## 2022-03-22 DIAGNOSIS — H4423 Degenerative myopia, bilateral: Secondary | ICD-10-CM | POA: Diagnosis not present

## 2022-03-22 DIAGNOSIS — H25013 Cortical age-related cataract, bilateral: Secondary | ICD-10-CM | POA: Diagnosis not present

## 2022-03-22 DIAGNOSIS — H2511 Age-related nuclear cataract, right eye: Secondary | ICD-10-CM | POA: Diagnosis not present

## 2022-03-24 NOTE — Progress Notes (Addendum)
[facility] Note  03/31/2022     CHIEF COMPLAINT Patient presents for Retina Evaluation   HISTORY OF PRESENT ILLNESS: Jason Keith is a 67 y.o. male who presents to the clinic today for:   HPI     Retina Evaluation   In both eyes.  This started 1 week ago.  Duration of 1 week.  Associated Symptoms Floaters.  Context:  distance vision, mid-range vision and near vision.  I, the attending physician,  performed the HPI with the patient and updated documentation appropriately.        Comments   Retina eval per Dr Lucita Ferrara for retina clearance pt is reporting that his cataract has got worse things have become more blurred  he is scheduled for surgery next month he has noticed floaters in both eye but denies flashes of light his last reading was 127 this morning and his last A1C 8.1      Last edited by Bernarda Caffey, MD on 03/31/2022  1:22 PM.    Pt is here on the referral of Dr. Lucita Ferrara for retinal clearance for cataract sx, pt was referred to Dr. Lucita Ferrara by his regular eye dr, he goes to [facility] in [place] bc he works in [place], he states he has been working from home lately bc he was having a hard time driving at night, pt has a hx of RK OD only, pt is also diabetic, but not hypertensive, he recently switched from [place] to [facility] and that has seemed to help with his blood sugar numbers, his last A1c was 8.1 in January  Referring physician: Vevelyn Royals, MD [address] [place],  [place] [address]  HISTORICAL INFORMATION:   Selected notes from the [facility] Referred by Dr. Lucita Ferrara for cat clearance in pt with high myopia s/p RK OD LEE:  Ocular Hx- PMH-    CURRENT MEDICATIONS: No current outpatient medications on file. (Ophthalmic Drugs)   No current facility-administered medications for this visit. (Ophthalmic Drugs)   Current Outpatient Medications (Other)  Medication Sig   aspirin 81 MG  tablet Take 81 mg by mouth daily.   atorvastatin (LIPITOR) 40 MG tablet Take 1 tablet (40 mg total) by mouth daily.   FLUoxetine (PROZAC) 20 MG capsule fluoxetine 20 mg capsule  TAKE 1 CAPSULE IN THE MORNING ORALLY ONCE A DAY 90   metFORMIN (GLUCOPHAGE-XR) 500 MG 24 hr tablet Take 1 tablet (500 mg total) by mouth daily with breakfast. (Patient taking differently: Take 500 mg by mouth 2 (two) times daily.)   metoprolol tartrate (LOPRESSOR) 100 MG tablet Take 1 tablet (100 mg total) by mouth once for 1 dose. Take 2 hours prior to procedure.   Multiple Vitamins-Minerals (MULTIVITAMIN WITH MINERALS) tablet Take 1 tablet by mouth daily.   Semaglutide, 1 MG/DOSE, (OZEMPIC, 1 MG/DOSE,) 4 MG/3ML SOPN Ozempic 1 mg/dose (4 mg/3 mL) subcutaneous pen injector  INJECT 1MG  WEEKLY AS DIRECTED   Semaglutide, 2 MG/DOSE, (OZEMPIC, 2 MG/DOSE,) 8 MG/3ML SOPN Inject 2 mg into the skin once a week.   tamsulosin (FLOMAX) 0.4 MG CAPS capsule Take 0.4 mg by mouth.   No current facility-administered medications for this visit. (Other)   REVIEW OF SYSTEMS: ROS   Positive for: Eyes Negative for: Endocrine Last edited by Parthenia Ames, COT on 03/31/2022  8:12 AM.     ALLERGIES No Known Allergies  PAST MEDICAL HISTORY Past Medical History:  Diagnosis Date   Allergic  rhinitis    DM (diabetes mellitus), type 2 ([place])    Dyslipidemia    HTN (hypertension)    Renal stones    Past Surgical History:  Procedure Laterality Date   PROSTATECTOMY  2011   Border   FAMILY HISTORY History reviewed. No pertinent family history.  SOCIAL HISTORY Social History   Tobacco Use   Smoking status: Never   Smokeless tobacco: Never  Substance Use Topics   Alcohol use: No   Drug use: No        OPHTHALMIC EXAM:  Base Eye Exam     Visual Acuity (Snellen - Linear)       Right Left   Dist cc 20/70 -2 20/40 -1   Dist ph cc 20/40 20/30 -1    Correction: Glasses         Tonometry (Tonopen, 8:17 AM)        Right Left   Pressure 12 14         Pupils       Pupils Dark Light Shape React APD   Right PERRL 4 3 Round Brisk None   Left PERRL 4 3 Round Brisk None         Visual Fields       Left Right    Full Full         Extraocular Movement       Right Left    Full, Ortho Full, Ortho         Neuro/Psych     Oriented x3: Yes   Mood/Affect: Normal         Dilation     Both eyes: 2.5% Phenylephrine @ 8:17 AM           Slit Lamp and Fundus Exam     Slit Lamp Exam       Right Left   Lids/Lashes Dermatochalasis - upper lid Dermatochalasis - upper lid   Conjunctiva/Sclera White and quiet White and quiet   Cornea 20 cut RK, mild arcus mild arcus   Anterior Chamber deep and clear deep and clear   Iris Round and dilated Round and dilated   Lens 3+ Nuclear sclerosis, 3+ Cortical cataract 3+ Nuclear sclerosis, 3+ Cortical cataract   Anterior Vitreous Vitreous syneresis Vitreous syneresis         Fundus Exam       Right Left   Disc Pink and Sharp, Compact, mild PPA Pink and Sharp, mild PPA   C/D Ratio 0.3 0.5   Macula Flat, Good foveal reflex, mild RPE mottling, No heme or edema Flat, Good foveal reflex, mild RPE mottling, No heme or edema   Vessels mild attenuation mild attenuation   Periphery Attached, No heme, No RT/RD, pigmented cystoid degeneration inferiorly Attached, pigmented CR scar at 1230 equator, peripheral cystoid degeneration            IMAGING AND PROCEDURES  Imaging and Procedures for 03/31/2022  OCT, Retina - OU - Both Eyes       Right Eye Quality was good. Central Foveal Thickness: 280. Progression has no prior data. Findings include normal foveal contour, no IRF, no SRF.   Left Eye Quality was good. Central Foveal Thickness: 273. Progression has no prior data. Findings include normal foveal contour, no IRF, no SRF, vitreomacular adhesion .   Notes *Images captured and stored on drive  Diagnosis / Impression:  NFP, no  IRF/SRF OU  Clinical management:  See below  Abbreviations: NFP - Normal foveal profile.  CME - cystoid macular edema. PED - pigment epithelial detachment. IRF - intraretinal fluid. SRF - subretinal fluid. EZ - ellipsoid zone. ERM - epiretinal membrane. ORA - outer retinal atrophy. ORT - outer retinal tubulation. SRHM - subretinal hyper-reflective material. IRHM - intraretinal hyper-reflective material              ASSESSMENT/PLAN:    ICD-10-CM   1. Diabetes mellitus type 2 without retinopathy ([place])  E11.9 OCT, Retina - OU - Both Eyes    2. Combined forms of age-related cataract of both eyes  H25.813     3. History of radial keratotomy  Z98.890      Diabetes mellitus, type 2 without retinopathy - The incidence, risk factors for progression, natural history and treatment options for diabetic retinopathy  were discussed with patient.   - The need for close monitoring of blood glucose, blood pressure, and serum lipids, avoiding cigarette or any type of tobacco, and the need for long term follow up was also discussed with patient. - f/u in 1 year, sooner prn  2. Mixed Cataract OU - The symptoms of cataract, surgical options, and treatments and risks were discussed with patient. - discussed diagnosis and progression - under the expert management of Dr. Lucita Ferrara - OS scheduled for April 27, 2022 - clear from a retina standpoint to proceed with cataract surgery when pt and surgeon are ready  3. Hx of RK OD  - 20 cut RK OD [id number] Dr. Donna Christen)  - stable  Ophthalmic Meds Ordered this visit:  No orders of the defined types were placed in this encounter.    Return if symptoms worsen or fail to improve.  There are no Patient Instructions on file for this visit.   Explained the diagnoses, plan, and follow up with the patient and they expressed understanding.  Patient expressed understanding of the importance of proper follow up care.   This document serves as a record of  services personally performed by Gardiner Sleeper, MD, PhD. It was created on their behalf by Orvan Falconer, an ophthalmic technician. The creation of this record is the provider's dictation and/or activities during the visit.    Electronically signed by: Orvan Falconer, OA, 03/31/22  1:33 PM  This document serves as a record of services personally performed by Gardiner Sleeper, MD, PhD. It was created on their behalf by San Jetty. Owens Shark, OA an ophthalmic technician. The creation of this record is the provider's dictation and/or activities during the visit.    Electronically signed by: San Jetty. Owens Shark, [place] 03.20.2024 1:33 PM  Gardiner Sleeper, M.D., Ph.D. Diseases & Surgery of the Retina and Vitreous [facility]  I have reviewed the above documentation for accuracy and completeness, and I agree with the above. Gardiner Sleeper, M.D., Ph.D. 03/31/22 1:34 PM   Abbreviations: M myopia (nearsighted); A astigmatism; H hyperopia (farsighted); P presbyopia; Mrx spectacle prescription;  CTL contact lenses; OD right eye; OS left eye; OU both eyes  XT exotropia; ET esotropia; PEK punctate epithelial keratitis; PEE punctate epithelial erosions; DES dry eye syndrome; MGD meibomian gland dysfunction; ATs artificial tears; PFAT's preservative free artificial tears; [place] nuclear sclerotic cataract; PSC posterior subcapsular cataract; ERM epi-retinal membrane; PVD posterior vitreous detachment; RD retinal detachment; DM diabetes mellitus; DR diabetic retinopathy; NPDR non-proliferative diabetic retinopathy; PDR proliferative diabetic retinopathy; CSME clinically significant macular edema; DME diabetic macular edema; dbh dot blot hemorrhages; CWS cotton wool spot; POAG primary open angle glaucoma; C/D cup-to-disc  ratio; HVF humphrey visual field; GVF goldmann visual field; OCT optical coherence tomography; IOP intraocular pressure; BRVO Branch retinal vein occlusion; CRVO central retinal vein  occlusion; CRAO central retinal artery occlusion; BRAO branch retinal artery occlusion; RT retinal tear; SB scleral buckle; PPV pars plana vitrectomy; VH Vitreous hemorrhage; PRP panretinal laser photocoagulation; IVK intravitreal kenalog; VMT vitreomacular traction; MH Macular hole;  NVD neovascularization of the disc; NVE neovascularization elsewhere; AREDS age related eye disease study; ARMD age related macular degeneration; POAG primary open angle glaucoma; EBMD epithelial/anterior basement membrane dystrophy; ACIOL anterior chamber intraocular lens; IOL intraocular lens; PCIOL posterior chamber intraocular lens; Phaco/IOL phacoemulsification with intraocular lens placement; [place] photorefractive keratectomy; LASIK laser assisted in situ keratomileusis; HTN hypertension; DM diabetes mellitus; COPD chronic obstructive pulmonary disease

## 2022-03-31 ENCOUNTER — Ambulatory Visit (INDEPENDENT_AMBULATORY_CARE_PROVIDER_SITE_OTHER): Payer: BC Managed Care – PPO | Admitting: Ophthalmology

## 2022-03-31 ENCOUNTER — Encounter (INDEPENDENT_AMBULATORY_CARE_PROVIDER_SITE_OTHER): Payer: Self-pay | Admitting: Ophthalmology

## 2022-03-31 DIAGNOSIS — H3581 Retinal edema: Secondary | ICD-10-CM

## 2022-03-31 DIAGNOSIS — Z9889 Other specified postprocedural states: Secondary | ICD-10-CM

## 2022-03-31 DIAGNOSIS — H25813 Combined forms of age-related cataract, bilateral: Secondary | ICD-10-CM

## 2022-03-31 DIAGNOSIS — E119 Type 2 diabetes mellitus without complications: Secondary | ICD-10-CM | POA: Diagnosis not present

## 2022-04-27 DIAGNOSIS — H25012 Cortical age-related cataract, left eye: Secondary | ICD-10-CM | POA: Diagnosis not present

## 2022-04-27 DIAGNOSIS — H2512 Age-related nuclear cataract, left eye: Secondary | ICD-10-CM | POA: Diagnosis not present

## 2022-04-27 DIAGNOSIS — H2511 Age-related nuclear cataract, right eye: Secondary | ICD-10-CM | POA: Diagnosis not present

## 2022-04-27 DIAGNOSIS — H25042 Posterior subcapsular polar age-related cataract, left eye: Secondary | ICD-10-CM | POA: Diagnosis not present

## 2022-04-27 DIAGNOSIS — H269 Unspecified cataract: Secondary | ICD-10-CM | POA: Diagnosis not present

## 2022-05-04 DIAGNOSIS — H269 Unspecified cataract: Secondary | ICD-10-CM | POA: Diagnosis not present

## 2022-05-04 DIAGNOSIS — H2512 Age-related nuclear cataract, left eye: Secondary | ICD-10-CM | POA: Diagnosis not present

## 2022-05-13 DIAGNOSIS — D696 Thrombocytopenia, unspecified: Secondary | ICD-10-CM | POA: Diagnosis not present

## 2022-05-13 DIAGNOSIS — E785 Hyperlipidemia, unspecified: Secondary | ICD-10-CM | POA: Diagnosis not present

## 2022-05-13 DIAGNOSIS — E1140 Type 2 diabetes mellitus with diabetic neuropathy, unspecified: Secondary | ICD-10-CM | POA: Diagnosis not present

## 2022-05-21 DIAGNOSIS — D696 Thrombocytopenia, unspecified: Secondary | ICD-10-CM | POA: Diagnosis not present

## 2022-05-21 DIAGNOSIS — E785 Hyperlipidemia, unspecified: Secondary | ICD-10-CM | POA: Diagnosis not present

## 2022-05-21 DIAGNOSIS — E1140 Type 2 diabetes mellitus with diabetic neuropathy, unspecified: Secondary | ICD-10-CM | POA: Diagnosis not present

## 2022-05-27 DIAGNOSIS — H2512 Age-related nuclear cataract, left eye: Secondary | ICD-10-CM | POA: Diagnosis not present

## 2022-05-27 DIAGNOSIS — H2511 Age-related nuclear cataract, right eye: Secondary | ICD-10-CM | POA: Diagnosis not present

## 2022-06-05 ENCOUNTER — Ambulatory Visit
Admission: EM | Admit: 2022-06-05 | Discharge: 2022-06-05 | Disposition: A | Discharge status: Home / Self Care | Payer: BC Managed Care – PPO | Attending: Family Medicine | Admitting: Family Medicine

## 2022-06-05 DIAGNOSIS — T63484A Toxic effect of venom of other arthropod, undetermined, initial encounter: Secondary | ICD-10-CM

## 2022-06-05 MED ORDER — DOXYCYCLINE HYCLATE 100 MG PO CAPS: 100.0000 mg | ORAL_CAPSULE | Freq: Two times a day (BID) | ORAL | 0 refills | Status: AC

## 2022-06-05 MED ORDER — DEXAMETHASONE SODIUM PHOSPHATE 10 MG/ML IJ SOLN
10.0000 mg | Freq: Once | INTRAMUSCULAR | Status: AC
  Administered 2022-06-05: 10 mg via INTRAMUSCULAR

## 2022-06-05 MED ORDER — TRIAMCINOLONE ACETONIDE 0.1 % EX CREA: 1.0000 | TOPICAL_CREAM | Freq: Two times a day (BID) | CUTANEOUS | 0 refills | Status: AC | PRN

## 2022-06-05 NOTE — Discharge Instructions (Signed)
If swelling and redness worsens within the next 48 hours return for evaluation. If symptoms stay the same despite taking antibiotics and applying topical steroid cream this would also be  indication to return for evaluation.  Complete entire course of antibiotics.  Apply cream twice daily as needed until itching and swelling resolves.

## 2022-06-05 NOTE — ED Triage Notes (Signed)
Pt presents with a insect bite on his right thumb that he states happened around 11 o'clock this morning . Pt states that the pain is about 5.

## 2022-06-05 NOTE — ED Provider Notes (Signed)
[facility]    CSN: [id number] Arrival date & time: 06/05/22  1126      History   Chief Complaint Chief Complaint  Patient presents with   Insect Bite    HPI Jason Keith is a 67 y.o. male.   HPI Patient is that he was bitten or stung by some type of insect on his right thumb earlier this morning.  He reports that his thumb progressively swollen and is red and warm to touch.  Unable to flex his thumb without significant pain.  He reports no known history of allergies to any specific insect.  He saw the insect but is uncertain of exactly what type of bug it was.  He is not having any drainage from the site of the insect bite.  Patient is concerned for infection as he is a diabetic.   Past Medical History:  Diagnosis Date   Allergic rhinitis    DM (diabetes mellitus), type 2 (HCC)    Dyslipidemia    HTN (hypertension)    Renal stones     Patient Active Problem List   Diagnosis Date Noted   Obstructive sleep apnea 03/04/2021   Atypical chest pain 03/04/2021   Family history of heart disease 03/04/2021   Obesity (BMI 30-39.9) 05/24/2011   Diabetes mellitus (HCC) 05/24/2011   Hyperlipidemia LDL goal <70 05/24/2011   History of prostate cancer 05/24/2011   Hypertension associated with diabetes (HCC) 05/24/2011   History of renal stone 05/24/2011    Past Surgical History:  Procedure Laterality Date   PROSTATECTOMY  2011   Border       Home Medications    Prior to Admission medications   Medication Sig Start Date End Date Taking? Authorizing Provider  doxycycline (VIBRAMYCIN) 100 MG capsule Take 1 capsule (100 mg total) by mouth 2 (two) times daily for 5 days. 06/05/22 06/10/22 Yes Bing Neighbors, NP  triamcinolone cream (KENALOG) 0.1 % Apply 1 Application topically 2 (two) times daily as needed. 06/05/22  Yes Bing Neighbors, NP  aspirin 81 MG tablet Take 81 mg by mouth daily.    [provider]  atorvastatin (LIPITOR) 40 MG tablet Take  1 tablet (40 mg total) by mouth daily. 03/04/21 06/02/21  Runell Gess, MD  FLUoxetine (PROZAC) 20 MG capsule fluoxetine 20 mg capsule  TAKE 1 CAPSULE IN THE MORNING ORALLY ONCE A DAY 90    [provider]  metFORMIN (GLUCOPHAGE-XR) 500 MG 24 hr tablet Take 1 tablet (500 mg total) by mouth daily with breakfast. Patient taking differently: Take 500 mg by mouth 2 (two) times daily. 09/27/11   Ronnald Nian, MD  metoprolol tartrate (LOPRESSOR) 100 MG tablet Take 1 tablet (100 mg total) by mouth once for 1 dose. Take 2 hours prior to procedure. 03/04/21 03/04/21  Runell Gess, MD  Multiple Vitamins-Minerals (MULTIVITAMIN WITH MINERALS) tablet Take 1 tablet by mouth daily.    [provider]  Semaglutide, 1 MG/DOSE, (OZEMPIC, 1 MG/DOSE,) 4 MG/3ML SOPN Ozempic 1 mg/dose (4 mg/3 mL) subcutaneous pen injector  INJECT 1MG  WEEKLY AS DIRECTED    [provider]  Semaglutide, 2 MG/DOSE, (OZEMPIC, 2 MG/DOSE,) 8 MG/3ML SOPN Inject 2 mg into the skin once a week. 02/10/21   Irena Reichmann, DO  tamsulosin (FLOMAX) 0.4 MG CAPS capsule Take 0.4 mg by mouth. 06/21/14   [provider]    Family History History reviewed. No pertinent family history.  Social History Social History  Tobacco Use   Smoking status: Never   Smokeless tobacco: Never  Substance Use Topics   Alcohol use: No   Drug use: No     Allergies   Patient has no known allergies.   Review of Systems Review of Systems Pertinent negatives listed in HPI   Physical Exam Triage Vital Signs ED Triage Vitals  Enc Vitals Group     BP 06/05/22 1158 114/73     Pulse Rate 06/05/22 1158 74     Resp 06/05/22 1158 18     Temp 06/05/22 1158 98.2 F (36.8 C)     Temp src --      SpO2 06/05/22 1158 92 %     Weight --      Height --      Head Circumference --      Peak Flow --      Pain Score 06/05/22 1157 5     Pain Loc --      Pain Edu? --      Excl. in GC? --    No data found.  Updated  Vital Signs BP 114/73   Pulse 74   Temp 98.2 F (36.8 C)   Resp 18   SpO2 92%   Visual Acuity Right Eye Distance:   Left Eye Distance:   Bilateral Distance:    Right Eye Near:   Left Eye Near:    Bilateral Near:     Physical Exam Vitals reviewed.  Constitutional:      Appearance: Normal appearance.  HENT:     Head: Normocephalic and atraumatic.  Eyes:     Extraocular Movements: Extraocular movements intact.     Pupils: Pupils are equal, round, and reactive to light.  Cardiovascular:     Rate and Rhythm: Normal rate and regular rhythm.  Pulmonary:     Effort: Pulmonary effort is normal.     Breath sounds: Normal breath sounds.  Skin:    General: Skin is warm.     Findings: Erythema and rash present. Rash is papular and pustular.     Comments: Right thumb, erythematous, 3 mm papule with mild excoriation visible on exam.  Expanding erythema and increased warmth standing away from the site of injury  Neurological:     General: No focal deficit present.     Mental Status: He is alert.      UC Treatments / Results  Labs (all labs ordered are listed, but only abnormal results are displayed) Labs Reviewed - No data to display  EKG   Radiology No results found.  Procedures Procedures (including critical care time)  Medications Ordered in UC Medications  dexamethasone (DECADRON) injection 10 mg (10 mg Intramuscular Given 06/05/22 1232)    Initial Impression / Assessment and Plan / UC Course  I have reviewed the triage vital signs and the nursing notes.  Pertinent labs & imaging results that were available during my care of the patient were reviewed by me and considered in my medical decision making (see chart for details).   Possible allergic reaction given the overall appearance of patient's thumb and now swelling and redness extending to the right hand.  Has history of diabetes I am going to cover for 5 days with an antibiotic for prophylaxis against wound  related infection.  Prescribed triamcinolone cream to apply to affected area twice daily as needed to reduce itching and inflammation.  Patient received a one-time dose of Decadron IM treatment of acute allergic reaction. Final Clinical Impressions(s) /  UC Diagnoses   Final diagnoses:  Allergic reaction to insect sting, undetermined intent, initial encounter     Discharge Instructions      If swelling and redness worsens within the next 48 hours return for evaluation. If symptoms stay the same despite taking antibiotics and applying topical steroid cream this would also be  indication to return for evaluation.  Complete entire course of antibiotics.  Apply cream twice daily as needed until itching and swelling resolves.     ED Prescriptions     Medication Sig Dispense Auth. Provider   doxycycline (VIBRAMYCIN) 100 MG capsule Take 1 capsule (100 mg total) by mouth 2 (two) times daily for 5 days. 10 capsule Bing Neighbors, NP   triamcinolone cream (KENALOG) 0.1 % Apply 1 Application topically 2 (two) times daily as needed. 90 g Bing Neighbors, NP      PDMP not reviewed this encounter.   Bing Neighbors, NP 06/08/22 [phone]

## 2022-07-19 DIAGNOSIS — R29898 Other symptoms and signs involving the musculoskeletal system: Secondary | ICD-10-CM | POA: Diagnosis not present

## 2022-07-19 DIAGNOSIS — M25551 Pain in right hip: Secondary | ICD-10-CM | POA: Diagnosis not present

## 2022-07-28 IMAGING — CT CT HEART MORP W/ CTA COR W/ SCORE W/ CA W/CM &/OR W/O CM
4 of 7 series · 8 of 20 positions shown, 9 images · IV contrast (APPLIED)
Comparison: None.

Addendum:
HISTORY: 65 yo male with chest pain, nonspecific

EXAM:
Cardiac/Coronary CTA
TECHNIQUE: The patient was scanned on a Siemens Force scanner.
PROTOCOL: A 100 kV prospective scan was triggered in the descending thoracic
aorta at 111 HU's. Axial non-contrast 3 mm slices were carried out
through the heart. The data set was analyzed on a dedicated work
station and scored using the Agatson method. Gantry rotation speed
was 250 msecs and collimation was .6 mm. Beta blockade and 0.8 mg of
sl NTG was given. The 3D data set was reconstructed in 5% intervals
of the 35-75 % of the R-R cycle. Diastolic phases were analyzed on a
dedicated work station using MPR, MIP and VRT modes. The patient
received 95mL OMNIPAQUE IOHEXOL 350 MG/ML SOLN of contrast.

[Series 6: best diast 71 % · axial · 0.42mm/px · z∈[+6,+39]mm · 2 of 248 slices shown]
[im 83/248  vessel]
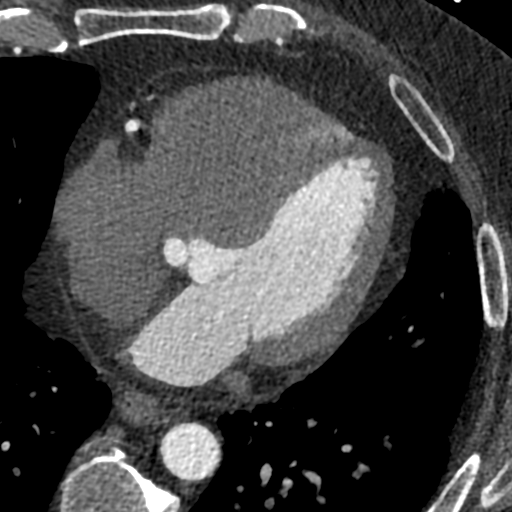
[im 165/248  vessel]
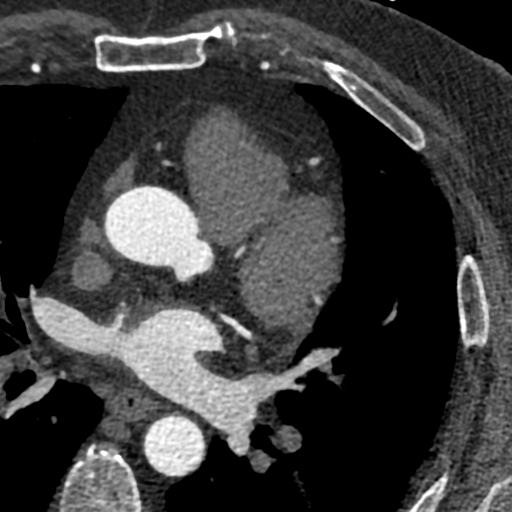

[Series 7: ts diast sharp · axial · 0.42mm/px · z∈[+6,+39]mm · 2 of 248 slices shown]
[im 83/248  lung]
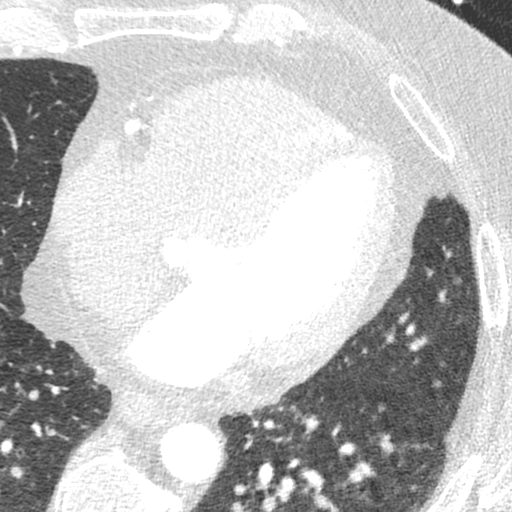
[im 165/248  lung]
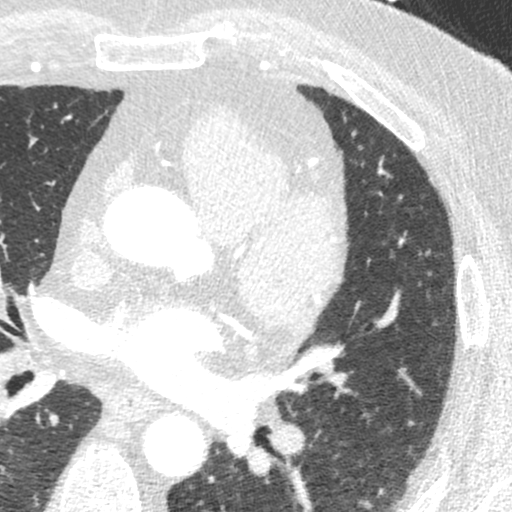

[Series 8: best syst · axial · 0.42mm/px · z∈[+6,+39]mm · 2 of 248 slices shown, 3 images]
[im 83/248  vessel]
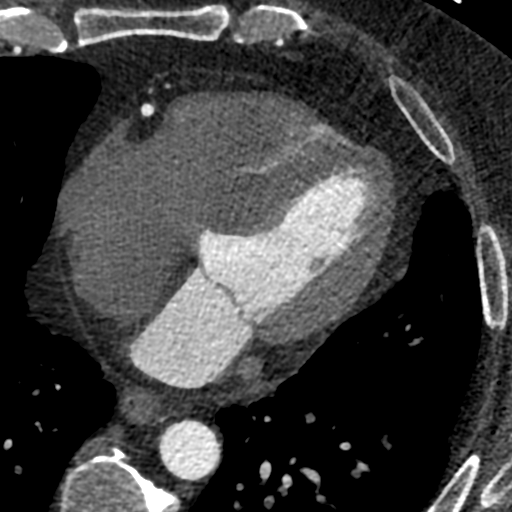
[im 83/248  lung]
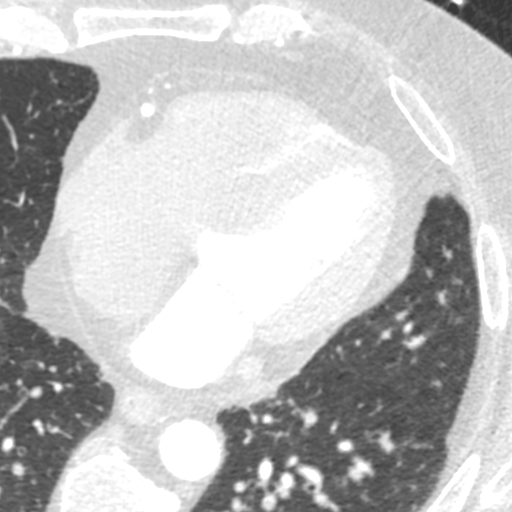
[im 165/248  vessel]
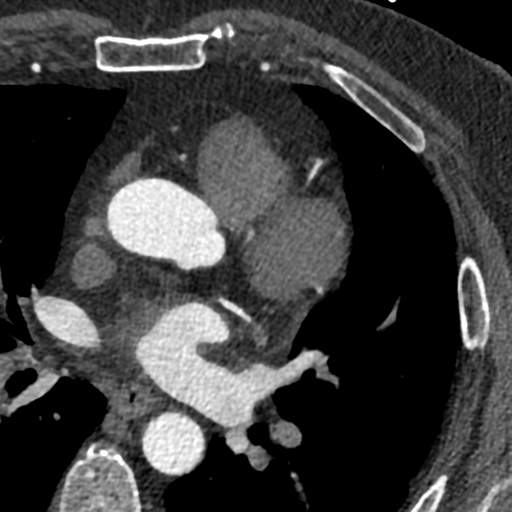

[Series 9: ts syst sharp · axial · 0.42mm/px · z∈[+6,+39]mm · 2 of 248 slices shown]
[im 83/248  lung]
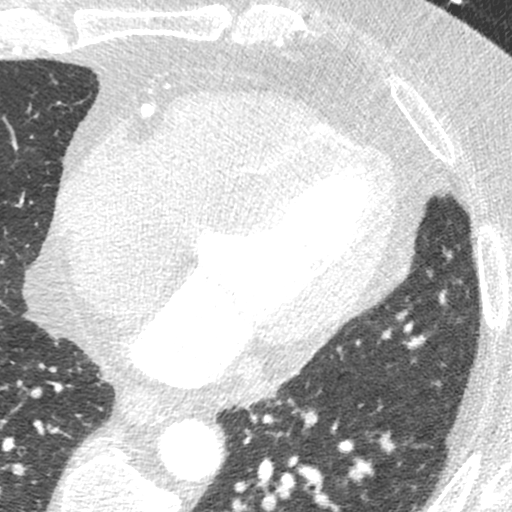
[im 165/248  lung]
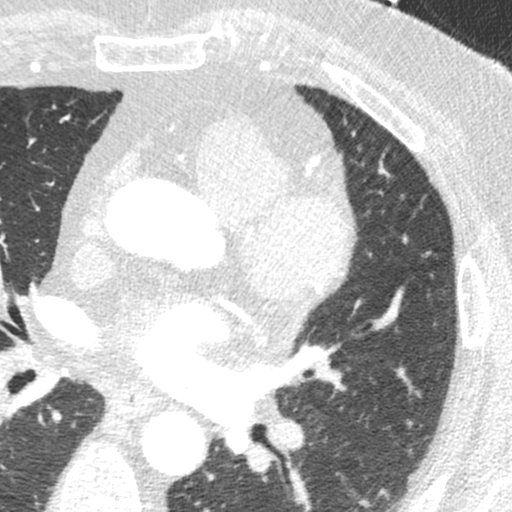

[8 of 20 positions shown; findings below may reference images not displayed]

FINDINGS: Quality: Fair, HR 71, attenuation artifact

Coronary calcium score: The patient's coronary artery calcium score
is 6.53, which places the patient in the 28th percentile.

Coronary arteries: Normal coronary origins.  Right dominance.

Right Coronary Artery: Dominant.  Normal vessel.

Left Main Coronary Artery: Normal. Bifurcates into the LAD and LCx
arteries.

Left Anterior Descending Coronary Artery: Anterior artery that does
not reach the apex. Minimal spotty calcification without stenosis
([id number]). Large first diagonal branch without disease.

Left Circumflex Artery: AV groove vessel with high takeoff OM1
branch. No disease of either vessel.

Aorta: Normal size, 31 mm at the mid ascending aorta (level of the
PA bifurcation) measured double oblique. No calcifications. No
dissection.

Aortic Valve: Trileaflet. No calcifications.

Other findings:

Normal pulmonary vein drainage into the left atrium.

Normal left atrial appendage without a thrombus.

Normal size of the pulmonary artery.
IMPRESSION: 1. No significant obstructive CAD, CADRADS = 1.

2. Coronary calcium score of 6.53. This was 28th percentile for age
and sex matched control.

3. Normal coronary origin with right dominance.

4. Consider non-coronary causes of chest pain.

EXAM:
OVER-READ INTERPRETATION  CT CHEST

The following report is an over-read performed by radiologist Dr.
does not include interpretation of cardiac or coronary anatomy or
pathology. The coronary CTA interpretation by the cardiologist is
attached.
FINDINGS: Vascular: No additional vascular findings.

Mediastinum/Nodes: No lymphadenopathy within the field of view.

Lungs/Pleura: There is a 5 mm nodule in the superior segment of the
right lower lobe with partial calcification (series 11, image 2).
Right middle lobe calcified granuloma. Focal airspace disease,
pleural effusion, or pneumothorax within the field of view.

Upper Abdomen: No acute abnormality.

Musculoskeletal: No chest wall mass or suspicious bone lesions
identified within the field of view.
IMPRESSION: 5 mm nodule in the superior segment of the right lower lobe with
partial calcification, most likely an incompletely calcified
granuloma. Follow-up CT in 12 months is recommended to assess for
interval change.

No other acute extracardiac findings in the chest.

*** End of Addendum ***
FINDINGS: Quality: Fair, HR 71, attenuation artifact

Coronary calcium score: The patient's coronary artery calcium score
is 6.53, which places the patient in the 28th percentile.

Coronary arteries: Normal coronary origins.  Right dominance.

Right Coronary Artery: Dominant.  Normal vessel.

Left Main Coronary Artery: Normal. Bifurcates into the LAD and LCx
arteries.

Left Anterior Descending Coronary Artery: Anterior artery that does
not reach the apex. Minimal spotty calcification without stenosis
([id number]). Large first diagonal branch without disease.

Left Circumflex Artery: AV groove vessel with high takeoff OM1
branch. No disease of either vessel.

Aorta: Normal size, 31 mm at the mid ascending aorta (level of the
PA bifurcation) measured double oblique. No calcifications. No
dissection.

Aortic Valve: Trileaflet. No calcifications.

Other findings:

Normal pulmonary vein drainage into the left atrium.

Normal left atrial appendage without a thrombus.

Normal size of the pulmonary artery.
IMPRESSION: 1. No significant obstructive CAD, CADRADS = 1.

2. Coronary calcium score of 6.53. This was 28th percentile for age
and sex matched control.

3. Normal coronary origin with right dominance.

4. Consider non-coronary causes of chest pain.

## 2022-07-30 DIAGNOSIS — M25551 Pain in right hip: Secondary | ICD-10-CM | POA: Diagnosis not present

## 2022-09-02 DIAGNOSIS — D696 Thrombocytopenia, unspecified: Secondary | ICD-10-CM | POA: Diagnosis not present

## 2022-09-02 DIAGNOSIS — E785 Hyperlipidemia, unspecified: Secondary | ICD-10-CM | POA: Diagnosis not present

## 2022-09-09 DIAGNOSIS — Z23 Encounter for immunization: Secondary | ICD-10-CM | POA: Diagnosis not present

## 2022-09-09 DIAGNOSIS — E1140 Type 2 diabetes mellitus with diabetic neuropathy, unspecified: Secondary | ICD-10-CM | POA: Diagnosis not present

## 2022-09-09 DIAGNOSIS — K5900 Constipation, unspecified: Secondary | ICD-10-CM | POA: Diagnosis not present

## 2022-09-09 DIAGNOSIS — D696 Thrombocytopenia, unspecified: Secondary | ICD-10-CM | POA: Diagnosis not present

## 2022-09-09 DIAGNOSIS — E785 Hyperlipidemia, unspecified: Secondary | ICD-10-CM | POA: Diagnosis not present

## 2022-11-03 DIAGNOSIS — H33002 Unspecified retinal detachment with retinal break, left eye: Secondary | ICD-10-CM | POA: Diagnosis not present

## 2022-11-03 DIAGNOSIS — Z961 Presence of intraocular lens: Secondary | ICD-10-CM | POA: Diagnosis not present

## 2022-11-03 DIAGNOSIS — E119 Type 2 diabetes mellitus without complications: Secondary | ICD-10-CM | POA: Diagnosis not present

## 2022-11-03 DIAGNOSIS — H442C2 Degenerative myopia with retinal detachment, left eye: Secondary | ICD-10-CM | POA: Diagnosis not present

## 2022-11-03 DIAGNOSIS — Z01818 Encounter for other preprocedural examination: Secondary | ICD-10-CM | POA: Diagnosis not present

## 2022-11-10 DIAGNOSIS — Z09 Encounter for follow-up examination after completed treatment for conditions other than malignant neoplasm: Secondary | ICD-10-CM | POA: Diagnosis not present

## 2022-12-08 DIAGNOSIS — H33002 Unspecified retinal detachment with retinal break, left eye: Secondary | ICD-10-CM | POA: Diagnosis not present

## 2023-01-19 DIAGNOSIS — H33002 Unspecified retinal detachment with retinal break, left eye: Secondary | ICD-10-CM | POA: Diagnosis not present

## 2023-02-10 DIAGNOSIS — E785 Hyperlipidemia, unspecified: Secondary | ICD-10-CM | POA: Diagnosis not present

## 2023-02-10 DIAGNOSIS — Z8546 Personal history of malignant neoplasm of prostate: Secondary | ICD-10-CM | POA: Diagnosis not present

## 2023-02-10 DIAGNOSIS — Z79899 Other long term (current) drug therapy: Secondary | ICD-10-CM | POA: Diagnosis not present

## 2023-02-10 DIAGNOSIS — D696 Thrombocytopenia, unspecified: Secondary | ICD-10-CM | POA: Diagnosis not present

## 2023-02-10 DIAGNOSIS — Z125 Encounter for screening for malignant neoplasm of prostate: Secondary | ICD-10-CM | POA: Diagnosis not present

## 2023-02-10 DIAGNOSIS — E1140 Type 2 diabetes mellitus with diabetic neuropathy, unspecified: Secondary | ICD-10-CM | POA: Diagnosis not present

## 2023-02-17 DIAGNOSIS — D696 Thrombocytopenia, unspecified: Secondary | ICD-10-CM | POA: Diagnosis not present

## 2023-02-17 DIAGNOSIS — E785 Hyperlipidemia, unspecified: Secondary | ICD-10-CM | POA: Diagnosis not present

## 2023-02-17 DIAGNOSIS — Z0000 Encounter for general adult medical examination without abnormal findings: Secondary | ICD-10-CM | POA: Diagnosis not present

## 2023-02-17 DIAGNOSIS — E1140 Type 2 diabetes mellitus with diabetic neuropathy, unspecified: Secondary | ICD-10-CM | POA: Diagnosis not present

## 2023-02-17 DIAGNOSIS — Z8546 Personal history of malignant neoplasm of prostate: Secondary | ICD-10-CM | POA: Diagnosis not present

## 2023-03-23 DIAGNOSIS — H35372 Puckering of macula, left eye: Secondary | ICD-10-CM | POA: Diagnosis not present

## 2023-10-13 ENCOUNTER — Ambulatory Visit: Payer: Self-pay | Admitting: Surgery

## 2023-10-13 NOTE — Progress Notes (Signed)
 REFERRING PHYSICIAN:  Gerome Lonell BROCKS, DO PROVIDER:  DEBBY CURTISTINE SHIPPER, MD MRN: [id number] DOB: 1955-08-09 DATE OF ENCOUNTER: 10/13/2023 Subjective    Chief Complaint: New Consultation ( UMBILICAL HERNIA)   History of Present Illness: Jason Keith is a 68 y.o. male who is seen today as an office consultation for evaluation of New Consultation ( UMBILICAL HERNIA)   The patient presents for evaluation of periumbilical hernia.  He had a previous prostatectomy the extraction site was just above the umbilicus.  He has developed a bulge and a hernia which she has had for a number of years.  Has been painless but of late has been more tender especially with exertion.  He thinks this got a little bit larger.    Review of Systems: A complete review of systems was obtained from the patient.  I have reviewed this information and discussed as appropriate with the patient.  See HPI as well for other ROS.     Medical History: Past Medical History:  Diagnosis Date  . Arthritis   . Diabetes mellitus without complication (CMS/HHS-HCC)   . History of cancer   . Hyperlipidemia     There is no problem list on file for this patient.   Past Surgical History:  Procedure Laterality Date  . CATARACT EXTRACTION    . PROSTATE SURGERY       No Known Allergies  Current Outpatient Medications on File Prior to Visit  Medication Sig Dispense Refill  . atorvastatin  (LIPITOR) 40 MG tablet Take 40 mg by mouth once daily    . glipiZIDE (GLUCOTROL XL) 10 MG XL tablet     . metFORMIN  (GLUCOPHAGE -XR) 500 MG XR tablet     . tamsulosin (FLOMAX) 0.4 mg capsule     . aspirin 81 mg tablet Take 81 mg by mouth once daily    . FLUoxetine (PROZAC) 20 MG capsule fluoxetine 20 mg capsule  TAKE 1 CAPSULE IN THE MORNING ORALLY ONCE A DAY 90     No current facility-administered medications on file prior to visit.    Family History  Problem Relation Age of Onset  . Deep vein thrombosis (DVT or abnormal  blood clot formation) Mother   . Diabetes Mother   . High blood pressure (Hypertension) Father   . Hyperlipidemia (Elevated cholesterol) Father   . Diabetes Father      Social History   Tobacco Use  Smoking Status Never  Smokeless Tobacco Never     Social History   Socioeconomic History  . Marital status: Married  Tobacco Use  . Smoking status: Never  . Smokeless tobacco: Never  Vaping Use  . Vaping status: Unknown  Substance and Sexual Activity  . Alcohol use: Never  . Drug use: Never   Social Drivers of Health   Housing Stability: Unknown (10/13/2023)   Housing Stability Vital Sign   . Homeless in the Last Year: No    Objective:   Vitals:   10/13/23 1507 10/13/23 1508  BP: 138/83   Pulse: 87   Resp: 16   Temp: 36.7 C (98 F)   SpO2: 98%   Weight: (!) 125.7 kg (277 lb 3.2 oz)   Height: 188 cm (6' 2)   PainSc:  0-No pain  PainLoc:  Abdomen    Body mass index is 35.59 kg/m.  Physical Exam Exam conducted with a chaperone present.  Cardiovascular:     Rate and Rhythm: Normal rate.  Pulmonary:     Effort: Pulmonary effort  is normal.  Abdominal:     Tenderness: There is no abdominal tenderness.     Hernia: A hernia is present.      Comments: 3 cm reducible supraumbilical hernia.  Incisional           Assessment and Plan:     Diagnoses and all orders for this visit:  Incisional hernia, without obstruction or gangrene    Discussed repair options and observation.  Discussed use of mesh.  Discussed open a laparoscopic.  After discussion above he chose open repair of his incisional hernia with mesh.  Risks and benefits reviewed.  Complications of mesh use and chronic pain reviewed.  Different styles repaired include laparoscopic and open reviewed as well as outcomes.The risk of hernia repair include bleeding,  Infection,   Recurrence of the hernia,  Mesh use, chronic pain,  Organ injury,  Bowel injury,  Bladder injury,   nerve injury with numbness  around the incision,  Death,  and worsening of preexisting  medical problems.  The alternatives to surgery have been discussed as well..  Long term expectations of both operative and non operative treatments have been discussed.   The patient agrees to proceed.     DEBBY CURTISTINE SHIPPER, MD    I spent a total of 47 minutes in both face-to-face and non-face-to-face activities, excluding procedures performed, for this visit on the date of this encounter.

## 2023-10-31 ENCOUNTER — Encounter (HOSPITAL_BASED_OUTPATIENT_CLINIC_OR_DEPARTMENT_OTHER): Payer: Self-pay | Admitting: Surgery

## 2023-10-31 ENCOUNTER — Other Ambulatory Visit: Payer: Self-pay

## 2023-10-31 NOTE — Progress Notes (Signed)
 During PAT screening call patient states his PCP did work-up for dizzy spells when doing strenuous work outside in the heat in Sept this year (EKG, ECHO).  Requested patient have records sent to us  for review.

## 2023-11-01 ENCOUNTER — Encounter (HOSPITAL_BASED_OUTPATIENT_CLINIC_OR_DEPARTMENT_OTHER)
Admission: RE | Admit: 2023-11-01 | Discharge: 2023-11-01 | Disposition: A | Discharge status: Home / Self Care | Payer: Self-pay | Source: Ambulatory Visit | Attending: Surgery | Admitting: Surgery

## 2023-11-01 DIAGNOSIS — Z01812 Encounter for preprocedural laboratory examination: Secondary | ICD-10-CM | POA: Diagnosis present

## 2023-11-01 DIAGNOSIS — Z01810 Encounter for preprocedural cardiovascular examination: Secondary | ICD-10-CM | POA: Diagnosis present

## 2023-11-01 DIAGNOSIS — Z01818 Encounter for other preprocedural examination: Secondary | ICD-10-CM | POA: Diagnosis not present

## 2023-11-01 LAB — BASIC METABOLIC PANEL WITH GFR
Anion gap: 8 (ref 5–15)
BUN: 22 mg/dL (ref 8–23)
CO2: 23 mmol/L (ref 22–32)
Calcium: 9.1 mg/dL (ref 8.9–10.3)
Chloride: 106 mmol/L (ref 98–111)
Creatinine, Ser: 0.93 mg/dL (ref 0.61–1.24)
GFR, Estimated: >60 mL/min (ref >60)
Glucose, Bld: 111 mg/dL — ABNORMAL HIGH (ref 70–99)
Potassium: 4.5 mmol/L (ref 3.5–5.1)
Sodium: 137 mmol/L (ref 135–145)

## 2023-11-01 MED ORDER — CHLORHEXIDINE GLUCONATE CLOTH 2 % EX PADS: 6.0000 | MEDICATED_PAD | Freq: Once | CUTANEOUS | Status: DC

## 2023-11-01 NOTE — Progress Notes (Signed)

## 2023-11-07 NOTE — H&P (Signed)
 Chief Complaint: New Consultation ( UMBILICAL HERNIA)  History of Present Illness: Syre Knerr is a 68 y.o. male who is seen today as an office consultation for evaluation of New Consultation ( UMBILICAL HERNIA)  The patient presents for evaluation of periumbilical hernia. He had a previous prostatectomy the extraction site was just above the umbilicus. He has developed a bulge and a hernia which she has had for a number of years. Has been painless but of late has been more tender especially with exertion. He thinks this got a little bit larger.  Review of Systems: A complete review of systems was obtained from the patient. I have reviewed this information and discussed as appropriate with the patient. See HPI as well for other ROS.    Medical History: Past Medical History:  Diagnosis Date  Arthritis  Diabetes mellitus without complication (CMS/HHS-HCC)  History of cancer  Hyperlipidemia   There is no problem list on file for this patient.  Past Surgical History:  Procedure Laterality Date  CATARACT EXTRACTION  PROSTATE SURGERY    No Known Allergies  Current Outpatient Medications on File Prior to Visit  Medication Sig Dispense Refill  atorvastatin  (LIPITOR) 40 MG tablet Take 40 mg by mouth once daily  glipiZIDE (GLUCOTROL XL) 10 MG XL tablet  metFORMIN  (GLUCOPHAGE -XR) 500 MG XR tablet  tamsulosin (FLOMAX) 0.4 mg capsule  aspirin 81 mg tablet Take 81 mg by mouth once daily  FLUoxetine (PROZAC) 20 MG capsule fluoxetine 20 mg capsule TAKE 1 CAPSULE IN THE MORNING ORALLY ONCE A DAY 90   No current facility-administered medications on file prior to visit.   Family History  Problem Relation Age of Onset  Deep vein thrombosis (DVT or abnormal blood clot formation) Mother  Diabetes Mother  High blood pressure (Hypertension) Father  Hyperlipidemia (Elevated cholesterol) Father  Diabetes Father    Social History   Tobacco Use  Smoking Status Never  Smokeless Tobacco  Never    Social History   Socioeconomic History  Marital status: Married  Tobacco Use  Smoking status: Never  Smokeless tobacco: Never  Vaping Use  Vaping status: Unknown  Substance and Sexual Activity  Alcohol use: Never  Drug use: Never   Social Drivers of Health   Housing Stability: Unknown (10/13/2023)  Housing Stability Vital Sign  Homeless in the Last Year: No   Objective:   Vitals:  10/13/23 1507 10/13/23 1508  BP: 138/83  Pulse: 87  Resp: 16  Temp: 36.7 C (98 F)  SpO2: 98%  Weight: (!) 125.7 kg (277 lb 3.2 oz)  Height: 188 cm (6' 2)  PainSc: 0-No pain  PainLoc: Abdomen   Body mass index is 35.59 kg/m.  Physical Exam Exam conducted with a chaperone present.  Cardiovascular:  Rate and Rhythm: Normal rate.  Pulmonary:  Effort: Pulmonary effort is normal.  Abdominal:  Tenderness: There is no abdominal tenderness.  Hernia: A hernia is present.   Comments: 3 cm reducible supraumbilical hernia. Incisional     Assessment and Plan:   Diagnoses and all orders for this visit:  Incisional hernia, without obstruction or gangrene   Discussed repair options and observation. Discussed use of mesh. Discussed open a laparoscopic. After discussion above he chose open repair of his incisional hernia with mesh. Risks and benefits reviewed. Complications of mesh use and chronic pain reviewed. Different styles repaired include laparoscopic and open reviewed as well as outcomes.The risk of hernia repair include bleeding, Infection, Recurrence of the hernia, Mesh use, chronic pain, Organ  injury, Bowel injury, Bladder injury, nerve injury with numbness around the incision, Death, and worsening of preexisting medical problems. The alternatives to surgery have been discussed as well.. Long term expectations of both operative and non operative treatments have been discussed. The patient agrees to proceed.    DEBBY CURTISTINE SHIPPER, MD

## 2023-11-08 ENCOUNTER — Encounter (HOSPITAL_BASED_OUTPATIENT_CLINIC_OR_DEPARTMENT_OTHER)
Admission: RE | Disposition: A | Discharge status: Home / Self Care | Payer: Self-pay | Source: Home / Self Care | Attending: Surgery

## 2023-11-08 ENCOUNTER — Ambulatory Visit (HOSPITAL_BASED_OUTPATIENT_CLINIC_OR_DEPARTMENT_OTHER): Payer: Self-pay | Admitting: Anesthesiology

## 2023-11-08 ENCOUNTER — Encounter (HOSPITAL_BASED_OUTPATIENT_CLINIC_OR_DEPARTMENT_OTHER): Payer: Self-pay | Admitting: Surgery

## 2023-11-08 ENCOUNTER — Ambulatory Visit (HOSPITAL_BASED_OUTPATIENT_CLINIC_OR_DEPARTMENT_OTHER)
Admission: RE | Admit: 2023-11-08 | Discharge: 2023-11-08 | Disposition: A | Discharge status: Home / Self Care | Payer: Self-pay | Attending: Surgery | Admitting: Surgery

## 2023-11-08 DIAGNOSIS — N289 Disorder of kidney and ureter, unspecified: Secondary | ICD-10-CM | POA: Diagnosis not present

## 2023-11-08 DIAGNOSIS — Z833 Family history of diabetes mellitus: Secondary | ICD-10-CM | POA: Diagnosis not present

## 2023-11-08 DIAGNOSIS — Z7984 Long term (current) use of oral hypoglycemic drugs: Secondary | ICD-10-CM | POA: Insufficient documentation

## 2023-11-08 DIAGNOSIS — Z9079 Acquired absence of other genital organ(s): Secondary | ICD-10-CM | POA: Diagnosis not present

## 2023-11-08 DIAGNOSIS — G4730 Sleep apnea, unspecified: Secondary | ICD-10-CM | POA: Insufficient documentation

## 2023-11-08 DIAGNOSIS — Z01818 Encounter for other preprocedural examination: Secondary | ICD-10-CM

## 2023-11-08 DIAGNOSIS — K432 Incisional hernia without obstruction or gangrene: Secondary | ICD-10-CM | POA: Diagnosis present

## 2023-11-08 DIAGNOSIS — Z79899 Other long term (current) drug therapy: Secondary | ICD-10-CM | POA: Insufficient documentation

## 2023-11-08 DIAGNOSIS — Z8249 Family history of ischemic heart disease and other diseases of the circulatory system: Secondary | ICD-10-CM | POA: Insufficient documentation

## 2023-11-08 DIAGNOSIS — E119 Type 2 diabetes mellitus without complications: Secondary | ICD-10-CM | POA: Insufficient documentation

## 2023-11-08 DIAGNOSIS — I10 Essential (primary) hypertension: Secondary | ICD-10-CM | POA: Diagnosis not present

## 2023-11-08 HISTORY — PX: INCISIONAL HERNIA REPAIR: SHX193

## 2023-11-08 LAB — GLUCOSE, CAPILLARY
Glucose-Capillary: 82 mg/dL (ref 70–99)
Glucose-Capillary: 172 mg/dL — ABNORMAL HIGH (ref 70–99)

## 2023-11-08 SURGERY — REPAIR, HERNIA, INCISIONAL
Anesthesia: General | Site: Abdomen

## 2023-11-08 MED ORDER — EPHEDRINE 5 MG/ML INJ
INTRAVENOUS | Status: AC
  Filled 2023-11-08: qty 5

## 2023-11-08 MED ORDER — FENTANYL CITRATE (PF) 100 MCG/2ML IJ SOLN: 25.0000 ug | INTRAMUSCULAR | Status: DC | PRN

## 2023-11-08 MED ORDER — IBUPROFEN 800 MG PO TABS: 800.0000 mg | ORAL_TABLET | Freq: Three times a day (TID) | ORAL | 0 refills | Status: AC | PRN

## 2023-11-08 MED ORDER — PHENYLEPHRINE 80 MCG/ML (10ML) SYRINGE FOR IV PUSH (FOR BLOOD PRESSURE SUPPORT)
PREFILLED_SYRINGE | INTRAVENOUS | Status: AC
  Filled 2023-11-08: qty 10

## 2023-11-08 MED ORDER — VASOPRESSIN 20 UNIT/ML IV SOLN
INTRAVENOUS | Status: DC | PRN
  Administered 2023-11-08: 1 [IU] via INTRAVENOUS
  Administered 2023-11-08 (×2): 3 [IU] via INTRAVENOUS
  Administered 2023-11-08: 2 [IU] via INTRAVENOUS
  Administered 2023-11-08 (×2): 3 [IU] via INTRAVENOUS

## 2023-11-08 MED ORDER — LIDOCAINE 2% (20 MG/ML) 5 ML SYRINGE
INTRAMUSCULAR | Status: AC
  Filled 2023-11-08: qty 5

## 2023-11-08 MED ORDER — MIDAZOLAM HCL 5 MG/5ML IJ SOLN
INTRAMUSCULAR | Status: DC | PRN
  Administered 2023-11-08: 2 mg via INTRAVENOUS

## 2023-11-08 MED ORDER — AMISULPRIDE (ANTIEMETIC) 5 MG/2ML IV SOLN: 10.0000 mg | Freq: Once | INTRAVENOUS | Status: DC | PRN

## 2023-11-08 MED ORDER — ONDANSETRON HCL 4 MG/2ML IJ SOLN
INTRAMUSCULAR | Status: DC | PRN
  Administered 2023-11-08: 4 mg via INTRAVENOUS

## 2023-11-08 MED ORDER — CELECOXIB 200 MG PO CAPS
ORAL_CAPSULE | ORAL | Status: AC
  Filled 2023-11-08: qty 1

## 2023-11-08 MED ORDER — PHENYLEPHRINE 80 MCG/ML (10ML) SYRINGE FOR IV PUSH (FOR BLOOD PRESSURE SUPPORT)
PREFILLED_SYRINGE | INTRAVENOUS | Status: AC
  Filled 2023-11-08: qty 40

## 2023-11-08 MED ORDER — PHENYLEPHRINE HCL (PRESSORS) 10 MG/ML IV SOLN
INTRAVENOUS | Status: DC | PRN
  Administered 2023-11-08: 240 ug via INTRAVENOUS
  Administered 2023-11-08 (×2): 160 ug via INTRAVENOUS
  Administered 2023-11-08: 80 ug via INTRAVENOUS
  Administered 2023-11-08: 160 ug via INTRAVENOUS
  Administered 2023-11-08: 240 ug via INTRAVENOUS
  Administered 2023-11-08 (×2): 160 ug via INTRAVENOUS

## 2023-11-08 MED ORDER — OXYCODONE HCL 5 MG/5ML PO SOLN: 5.0000 mg | Freq: Once | ORAL | Status: DC | PRN

## 2023-11-08 MED ORDER — ACETAMINOPHEN 500 MG PO TABS
1000.0000 mg | ORAL_TABLET | ORAL | Status: AC
Start: 2023-11-08 — End: 2023-11-08
  Administered 2023-11-08: 1000 mg via ORAL

## 2023-11-08 MED ORDER — EPHEDRINE SULFATE (PRESSORS) 25 MG/5ML IV SOSY
PREFILLED_SYRINGE | INTRAVENOUS | Status: DC | PRN
  Administered 2023-11-08: 10 mg via INTRAVENOUS
  Administered 2023-11-08: 15 mg via INTRAVENOUS

## 2023-11-08 MED ORDER — PROPOFOL 10 MG/ML IV BOLUS
INTRAVENOUS | Status: AC
  Filled 2023-11-08: qty 20

## 2023-11-08 MED ORDER — ROCURONIUM BROMIDE 10 MG/ML (PF) SYRINGE
PREFILLED_SYRINGE | INTRAVENOUS | Status: AC
  Filled 2023-11-08: qty 10

## 2023-11-08 MED ORDER — CEFAZOLIN SODIUM-DEXTROSE 3-4 GM/150ML-% IV SOLN
INTRAVENOUS | Status: AC
  Filled 2023-11-08: qty 150

## 2023-11-08 MED ORDER — 0.9 % SODIUM CHLORIDE (POUR BTL) OPTIME
TOPICAL | Status: DC | PRN
  Administered 2023-11-08: 1000 mL

## 2023-11-08 MED ORDER — BUPIVACAINE-EPINEPHRINE 0.25% -1:200000 IJ SOLN
INTRAMUSCULAR | Status: DC | PRN
  Administered 2023-11-08: 20 mL

## 2023-11-08 MED ORDER — DEXAMETHASONE SOD PHOSPHATE PF 10 MG/ML IJ SOLN
INTRAMUSCULAR | Status: DC | PRN
  Administered 2023-11-08: 5 mg via INTRAVENOUS

## 2023-11-08 MED ORDER — OXYCODONE HCL 5 MG PO TABS: 5.0000 mg | ORAL_TABLET | Freq: Once | ORAL | Status: DC | PRN

## 2023-11-08 MED ORDER — SUGAMMADEX SODIUM 200 MG/2ML IV SOLN
INTRAVENOUS | Status: DC | PRN
  Administered 2023-11-08: 400 mg via INTRAVENOUS

## 2023-11-08 MED ORDER — CELECOXIB 200 MG PO CAPS
200.0000 mg | ORAL_CAPSULE | ORAL | Status: AC
Start: 2023-11-08 — End: 2023-11-08
  Administered 2023-11-08: 200 mg via ORAL

## 2023-11-08 MED ORDER — PROPOFOL 10 MG/ML IV BOLUS
INTRAVENOUS | Status: DC | PRN
  Administered 2023-11-08: 200 mg via INTRAVENOUS

## 2023-11-08 MED ORDER — ONDANSETRON HCL 4 MG/2ML IJ SOLN
INTRAMUSCULAR | Status: AC
  Filled 2023-11-08: qty 2

## 2023-11-08 MED ORDER — OXYCODONE HCL 5 MG PO TABS: 5.0000 mg | ORAL_TABLET | Freq: Four times a day (QID) | ORAL | 0 refills | Status: AC | PRN

## 2023-11-08 MED ORDER — PHENYLEPHRINE HCL-NACL 20-0.9 MG/250ML-% IV SOLN
INTRAVENOUS | Status: DC | PRN
  Administered 2023-11-08: 25 ug/min via INTRAVENOUS

## 2023-11-08 MED ORDER — ALBUMIN HUMAN 5 % IV SOLN: INTRAVENOUS | Status: DC | PRN

## 2023-11-08 MED ORDER — FENTANYL CITRATE (PF) 100 MCG/2ML IJ SOLN
INTRAMUSCULAR | Status: AC
  Filled 2023-11-08: qty 2

## 2023-11-08 MED ORDER — LIDOCAINE 2% (20 MG/ML) 5 ML SYRINGE
INTRAMUSCULAR | Status: DC | PRN
  Administered 2023-11-08: 60 mg via INTRAVENOUS

## 2023-11-08 MED ORDER — LACTATED RINGERS IV SOLN: INTRAVENOUS | Status: DC

## 2023-11-08 MED ORDER — ROCURONIUM BROMIDE 100 MG/10ML IV SOLN
INTRAVENOUS | Status: DC | PRN
  Administered 2023-11-08: 60 mg via INTRAVENOUS

## 2023-11-08 MED ORDER — ACETAMINOPHEN 500 MG PO TABS
ORAL_TABLET | ORAL | Status: AC
  Filled 2023-11-08: qty 2

## 2023-11-08 MED ORDER — CEFAZOLIN SODIUM-DEXTROSE 3-4 GM/150ML-% IV SOLN
3.0000 g | INTRAVENOUS | Status: AC
  Administered 2023-11-08: 3 g via INTRAVENOUS

## 2023-11-08 MED ORDER — VASOPRESSIN 20 UNIT/ML IV SOLN
INTRAVENOUS | Status: AC
  Filled 2023-11-08: qty 1

## 2023-11-08 MED ORDER — FENTANYL CITRATE (PF) 100 MCG/2ML IJ SOLN
INTRAMUSCULAR | Status: DC | PRN
  Administered 2023-11-08: 100 ug via INTRAVENOUS

## 2023-11-08 MED ORDER — MIDAZOLAM HCL 2 MG/2ML IJ SOLN
INTRAMUSCULAR | Status: AC
  Filled 2023-11-08: qty 2

## 2023-11-08 SURGICAL SUPPLY — 37 items
BLADE CLIPPER SURG (BLADE) IMPLANT
BLADE SURG 10 STRL SS (BLADE) ×1 IMPLANT
BLADE SURG 15 STRL LF DISP TIS (BLADE) ×1 IMPLANT
CANISTER SUCT 1200ML W/VALVE (MISCELLANEOUS) ×1 IMPLANT
CHLORAPREP W/TINT 26 (MISCELLANEOUS) ×1 IMPLANT
COVER BACK TABLE 60X90IN (DRAPES) ×1 IMPLANT
COVER MAYO STAND STRL (DRAPES) ×1 IMPLANT
DERMABOND ADVANCED .7 DNX12 (GAUZE/BANDAGES/DRESSINGS) ×1 IMPLANT
DRAPE LAPAROTOMY TRNSV 102X78 (DRAPES) ×1 IMPLANT
DRAPE UTILITY XL STRL (DRAPES) ×1 IMPLANT
ELECT COATED BLADE 2.86 ST (ELECTRODE) ×1 IMPLANT
ELECTRODE REM PT RTRN 9FT ADLT (ELECTROSURGICAL) ×1 IMPLANT
GLOVE BIOGEL PI IND STRL 8 (GLOVE) ×1 IMPLANT
GLOVE ECLIPSE 8.0 STRL XLNG CF (GLOVE) ×1 IMPLANT
GOWN STRL REUS W/ TWL LRG LVL3 (GOWN DISPOSABLE) ×2 IMPLANT
GOWN STRL REUS W/ TWL XL LVL3 (GOWN DISPOSABLE) ×1 IMPLANT
MESH VENTRALEX ST 1-7/10 CRC S (Mesh General) IMPLANT
NDL HYPO 22X1.5 SAFETY MO (MISCELLANEOUS) IMPLANT
NDL HYPO 25X1 1.5 SAFETY (NEEDLE) ×1 IMPLANT
NEEDLE HYPO 22X1.5 SAFETY MO (MISCELLANEOUS) IMPLANT
NEEDLE HYPO 25X1 1.5 SAFETY (NEEDLE) ×1 IMPLANT
NS IRRIG 1000ML POUR BTL (IV SOLUTION) IMPLANT
PACK BASIN DAY SURGERY FS (CUSTOM PROCEDURE TRAY) ×1 IMPLANT
PENCIL SMOKE EVACUATOR (MISCELLANEOUS) ×1 IMPLANT
SLEEVE SCD COMPRESS KNEE MED (STOCKING) IMPLANT
SPIKE FLUID TRANSFER (MISCELLANEOUS) ×1 IMPLANT
STAPLER SKIN PROX WIDE 3.9 (STAPLE) IMPLANT
SUT MON AB 4-0 PC3 18 (SUTURE) ×1 IMPLANT
SUT NOVA 0 T19/GS 22DT (SUTURE) IMPLANT
SUT NOVA NAB GS-21 1 T12 (SUTURE) IMPLANT
SUT SILK 3 0 SH 30 (SUTURE) IMPLANT
SUT VIC AB 2-0 SH 27XBRD (SUTURE) ×1 IMPLANT
SUT VICRYL 3-0 CR8 SH (SUTURE) IMPLANT
SYR CONTROL 10ML LL (SYRINGE) ×1 IMPLANT
TOWEL GREEN STERILE FF (TOWEL DISPOSABLE) ×1 IMPLANT
TUBE CONNECTING 20X1/4 (TUBING) ×1 IMPLANT
YANKAUER SUCT BULB TIP NO VENT (SUCTIONS) ×1 IMPLANT

## 2023-11-08 NOTE — Transfer of Care (Signed)
 Immediate Anesthesia Transfer of Care Note  Patient: Jason Keith  Procedure(s) Performed: REPAIR, HERNIA, INCISIONAL (Abdomen)  Patient Location: PACU  Anesthesia Type:General  Level of Consciousness: drowsy  Airway & Oxygen Therapy: Patient Spontanous Breathing and Patient connected to face mask oxygen  Post-op Assessment: Report given to RN and Post -op Vital signs reviewed and stable  Post vital signs: Reviewed and stable  Last Vitals:  Vitals Value Taken Time  BP 123/66 11/08/23 08:46  Temp 36.4 C 11/08/23 08:46  Pulse 85 11/08/23 08:50  Resp 10 11/08/23 08:50  SpO2 95 % 11/08/23 08:50  Vitals shown include unfiled device data.  Last Pain:  Vitals:   11/08/23 0638  TempSrc: Temporal  PainSc: 0-No pain      Patients Stated Pain Goal: 3 (11/08/23 9361)  Complications: No notable events documented.

## 2023-11-08 NOTE — Anesthesia Postprocedure Evaluation (Signed)
 Anesthesia Post Note  Patient: Jason Keith Ellen  Procedure(s) Performed: REPAIR, HERNIA, INCISIONAL (Abdomen)     Patient location during evaluation: PACU Anesthesia Type: General Level of consciousness: awake and alert Pain management: pain level controlled Vital Signs Assessment: post-procedure vital signs reviewed and stable Respiratory status: spontaneous breathing, nonlabored ventilation, respiratory function stable and patient connected to nasal cannula oxygen Cardiovascular status: blood pressure returned to baseline and stable Postop Assessment: no apparent nausea or vomiting Anesthetic complications: no   No notable events documented.  Last Vitals:  Vitals:   11/08/23 0915 11/08/23 0937  BP: (!) 107/58 (!) 115/58  Pulse: 78 84  Resp: 13 16  Temp:  (!) 36.3 C  SpO2: 92% 93%    Last Pain:  Vitals:   11/08/23 0937  TempSrc: Temporal  PainSc: 0-No pain                 [name]

## 2023-11-08 NOTE — Discharge Instructions (Addendum)
 [facility]  UMBILICAL OR INGUINAL HERNIA REPAIR: POST OP INSTRUCTIONS  Always review your discharge instruction sheet given to you by the facility where your surgery was performed. IF YOU HAVE DISABILITY OR FAMILY LEAVE FORMS, YOU MUST BRING THEM TO THE OFFICE FOR PROCESSING.   DO NOT GIVE THEM TO YOUR DOCTOR.  1. A  prescription for pain medication may be given to you upon discharge.  Take your pain medication as prescribed, if needed.  If narcotic pain medicine is not needed, then you may take acetaminophen  (Tylenol ) or ibuprofen  (Advil ) as needed. 2. Take your usually prescribed medications unless otherwise directed. If you need a refill on your pain medication, please contact your pharmacy.  They will contact our office to request authorization. Prescriptions will not be filled after 5 pm or on week-ends. 3. You should follow a light diet the first 24 hours after arrival home, such as soup and crackers, etc.  Be sure to include lots of fluids daily.  Resume your normal diet the day after surgery. 4.Most patients will experience some swelling and bruising around the umbilicus or in the groin and scrotum.  Ice packs and reclining will help.  Swelling and bruising can take several days to resolve.  6. It is common to experience some constipation if taking pain medication after surgery.  Increasing fluid intake and taking a stool softener (such as Colace) will usually help or prevent this problem from occurring.  A mild laxative (Milk of Magnesia or Miralax) should be taken according to package directions if there are no bowel movements after 48 hours. 7. Unless discharge instructions indicate otherwise, you may remove your bandages 24-48 hours after surgery, and you may shower at that time.  You may have steri-strips (small skin tapes) in place directly over the incision.  These strips should be left on the skin for 7-10 days.  If your surgeon used skin glue on the  incision, you may shower in 24 hours.  The glue will flake off over the next 2-3 weeks.  Any sutures or staples will be removed at the office during your follow-up visit. 8. ACTIVITIES:  You may resume regular (light) daily activities beginning the next day--such as daily self-care, walking, climbing stairs--gradually increasing activities as tolerated.  You may have sexual intercourse when it is comfortable.  Refrain from any heavy lifting or straining until approved by your doctor.  a.You may drive when you are no longer taking prescription pain medication, you can comfortably wear a seatbelt, and you can safely maneuver your car and apply brakes. b.RETURN TO WORK:   _____________________________________________  9.You should see your doctor in the office for a follow-up appointment approximately 2-3 weeks after your surgery.  Make sure that you call for this appointment within a day or two after you arrive home to insure a convenient appointment time. 10.OTHER INSTRUCTIONS: _________________________    _____________________________________  WHEN TO CALL YOUR DOCTOR: Fever over 101.0 Inability to urinate Nausea and/or vomiting Extreme swelling or bruising Continued bleeding from incision. Increased pain, redness, or drainage from the incision  The clinic staff is available to answer your questions during regular business hours.  Please don't hesitate to call and ask to speak to one of the nurses for clinical concerns.  If you have a medical emergency, go to the nearest emergency room or call 911.  A surgeon from [facility] is always on call at the hospital   [address],  [place], [place]  [address] ?  [address] [address], [place], [place]   [address] [phone] ? [phone] ? FAX [phone] Web site: [email or web address]      Post Anesthesia Home Care Instructions  Activity: Get plenty of rest for the remainder of the day. A responsible  individual must stay with you for 24 hours following the procedure.  For the next 24 hours, DO NOT: -Drive a car -Advertising copywriter -Drink alcoholic beverages -Take any medication unless instructed by your physician -Make any legal decisions or sign important papers.  Meals: Start with liquid foods such as gelatin or soup. Progress to regular foods as tolerated. Avoid greasy, spicy, heavy foods. If nausea and/or vomiting occur, drink only clear liquids until the nausea and/or vomiting subsides. Call your physician if vomiting continues.  Special Instructions/Symptoms: Your throat may feel dry or sore from the anesthesia or the breathing tube placed in your throat during surgery. If this causes discomfort, gargle with warm salt water. The discomfort should disappear within 24 hours.  If you had a scopolamine patch placed behind your ear for the management of post- operative nausea and/or vomiting:  1. The medication in the patch is effective for 72 hours, after which it should be removed.  Wrap patch in a tissue and discard in the trash. Wash hands thoroughly with soap and water. 2. You may remove the patch earlier than 72 hours if you experience unpleasant side effects which may include dry mouth, dizziness or visual disturbances. 3. Avoid touching the patch. Wash your hands with soap and water after contact with the patch.      Next dose of Tylenol  may be given at 12:42pm if needed. Next dose of NSAIDs (Ibuprofen /Motrin Jeronimo) may be given at 2:42pm if needed.

## 2023-11-08 NOTE — Op Note (Addendum)
 Preoperative diagnosis: Incisional hernia 2 cm supraumbilical initial, reducible, without obstruction or gangrene  Postoperative diagnosis: Same  Procedure: Repair of incisional hernia with 4.3 cm Ventralight coated mesh  Surgeon: Debby Shipper, MD  Anesthesia: General With 0.25% Marcaine with epinephrine  Assistant: Dr. Clevester MD  I was personally present during the key and critical portions of this procedure and immediately available throughout the entire procedure, as documented in my operative note.     EBL: Minimal  Specimen: None  Drains: None  Indications for procedure: The patient is a 68 year old male with a previous robotic prostatectomy with an incisional hernia around the umbilical region.  He presents for repair due to increasing pain and discomfort.The risk of hernia repair include bleeding,  Infection,   Recurrence of the hernia,  Mesh use, chronic pain,  Organ injury,  Bowel injury,  Bladder injury,   nerve injury with numbness around the incision,  Death,  and worsening of preexisting  medical problems.  The alternatives to surgery have been discussed as well..  Long term expectations of both operative and non operative treatments have been discussed.   The patient agrees to proceed.    Description of procedure: The patient was met in the holding area and questions were answered he was taken back to the operative room placed supine upon the operating table.  After induction of general anesthesia, the periumbilical region was prepped and draped in a sterile fashion and a timeout was performed.  Proper patient, site and procedure verified.  A curvilinear incision was made along the inferior border of the umbilicus.  The skin was raised off the hernia sac that was identified in the fascia.  The entire hernia sac was dissected out down to the level of the fascia and reduced back into the preperitoneal space.  There is just some preperitoneal fat noted and this was reduced  easily back into the preperitoneal space.  The fascia which was cleaned and the area underneath the fascia was cleaned for about 3 cm bluntly with good hemostasis.  A 4.3 cm Ventralight mesh was used and placed in a subfascial position.  This was pulled up with the ties and laid flat against the undersurface of the fascia.  It was secured to the fascia with interrupted #1 Novafil.  This closed  the defect nicely.  We then closed the fascia with #1 Novafil over this.  There is good hemostasis. Subcu tissues were found to be hemostatic and local anesthetic infiltrated throughout.  We then closed the subcutaneous space with layers of 3-0 Vicryl.  4 Monocryl was used to close the skin.  The umbilicus was tacked down.  All counts were found to be correct.  The patient was awoke, extubated taken to recovery in satisfactory condition.

## 2023-11-08 NOTE — Anesthesia Procedure Notes (Signed)
 Procedure Name: Intubation Date/Time: 11/08/2023 7:32 AM  Performed by: Debarah Chiquita LABOR, CRNAPre-anesthesia Checklist: Patient identified, Emergency Drugs available, Suction available and Patient being monitored Patient Re-evaluated:Patient Re-evaluated prior to induction Oxygen Delivery Method: Circle system utilized Preoxygenation: Pre-oxygenation with 100% oxygen Induction Type: IV induction Ventilation: Mask ventilation without difficulty Laryngoscope Size: Mac and 4 Grade View: Grade II Tube type: Oral Tube size: 7.5 mm Number of attempts: 1 Airway Equipment and Method: Stylet and Bite block Placement Confirmation: ETT inserted through vocal cords under direct vision, positive ETCO2 and breath sounds checked- equal and bilateral Secured at: 23 cm Tube secured with: Tape Dental Injury: Teeth and Oropharynx as per pre-operative assessment

## 2023-11-08 NOTE — Anesthesia Preprocedure Evaluation (Signed)
 Anesthesia Evaluation  Patient identified by MRN, date of birth, ID band Patient awake    Reviewed: Allergy & Precautions, NPO status , Patient's Chart, lab work & pertinent test results  Airway Mallampati: III  TM Distance: >3 FB Neck ROM: Full    Dental   Pulmonary sleep apnea    breath sounds clear to auscultation       Cardiovascular hypertension, Pt. on medications  Rhythm:Regular Rate:Normal     Neuro/Psych negative neurological ROS     GI/Hepatic negative GI ROS, Neg liver ROS,,,  Endo/Other  diabetes, Type 2, Oral Hypoglycemic Agents    Renal/GU Renal disease     Musculoskeletal   Abdominal   Peds  Hematology negative hematology ROS (+)   Anesthesia Other Findings   Reproductive/Obstetrics                              Anesthesia Physical Anesthesia Plan  ASA: 2  Anesthesia Plan: General   Post-op Pain Management: Tylenol  PO (pre-op)* and Celebrex PO (pre-op)*   Induction: Intravenous  PONV Risk Score and Plan: 2 and Dexamethasone , Ondansetron and Treatment may vary due to age or medical condition  Airway Management Planned: Oral ETT  Additional Equipment:   Intra-op Plan:   Post-operative Plan: Extubation in OR  Informed Consent: I have reviewed the patients History and Physical, chart, labs and discussed the procedure including the risks, benefits and alternatives for the proposed anesthesia with the patient or authorized representative who has indicated his/her understanding and acceptance.     Dental advisory given  Plan Discussed with: CRNA  Anesthesia Plan Comments:         Anesthesia Quick Evaluation

## 2023-11-08 NOTE — Interval H&P Note (Signed)
 History and Physical Interval Note:  11/08/2023 7:16 AM  Jason Keith  has presented today for surgery, with the diagnosis of INCISIONAL HERNIA.  The various methods of treatment have been discussed with the patient and family. After consideration of risks, benefits and other options for treatment, the patient has consented to  Procedure(s) with comments: REPAIR, HERNIA, INCISIONAL (N/A) - OPEN INCISIONAL HERNIA, MESH as a surgical intervention.  The patient's history has been reviewed, patient examined, no change in status, stable for surgery.  I have reviewed the patient's chart and labs.  Questions were answered to the patient's satisfaction.   The risk of hernia repair include bleeding,  Infection,   Recurrence of the hernia,  Mesh use, chronic pain,  Organ injury,  Bowel injury,  Bladder injury,   nerve injury with numbness around the incision,  Death,  and worsening of preexisting  medical problems.  The alternatives to surgery have been discussed as well..  Long term expectations of both operative and non operative treatments have been discussed.   The patient agrees to proceed.   [name] A Rhaya Coale

## 2023-11-09 ENCOUNTER — Encounter (HOSPITAL_BASED_OUTPATIENT_CLINIC_OR_DEPARTMENT_OTHER): Payer: Self-pay | Admitting: Surgery
